# Patient Record
Sex: Female | Born: 1935
Health system: Southern US, Community
[De-identification: ages and names within clinical notes are randomized; demographics above are authoritative.]

## PROBLEM LIST (undated history)

## (undated) DIAGNOSIS — E039 Hypothyroidism, unspecified: Secondary | ICD-10-CM

## (undated) DIAGNOSIS — G43909 Migraine, unspecified, not intractable, without status migrainosus: Secondary | ICD-10-CM

## (undated) DIAGNOSIS — H3530 Unspecified macular degeneration: Secondary | ICD-10-CM

## (undated) DIAGNOSIS — K219 Gastro-esophageal reflux disease without esophagitis: Secondary | ICD-10-CM

## (undated) DIAGNOSIS — J189 Pneumonia, unspecified organism: Secondary | ICD-10-CM

## (undated) DIAGNOSIS — E079 Disorder of thyroid, unspecified: Secondary | ICD-10-CM

## (undated) DIAGNOSIS — M1990 Unspecified osteoarthritis, unspecified site: Secondary | ICD-10-CM

## (undated) DIAGNOSIS — K589 Irritable bowel syndrome without diarrhea: Secondary | ICD-10-CM

## (undated) DIAGNOSIS — H409 Unspecified glaucoma: Secondary | ICD-10-CM

## (undated) DIAGNOSIS — C801 Malignant (primary) neoplasm, unspecified: Secondary | ICD-10-CM

## (undated) DIAGNOSIS — I10 Essential (primary) hypertension: Secondary | ICD-10-CM

## (undated) DIAGNOSIS — T7840XA Allergy, unspecified, initial encounter: Secondary | ICD-10-CM

## (undated) DIAGNOSIS — E785 Hyperlipidemia, unspecified: Secondary | ICD-10-CM

## (undated) DIAGNOSIS — I951 Orthostatic hypotension: Secondary | ICD-10-CM

## (undated) HISTORY — DX: Migraine, unspecified, not intractable, without status migrainosus: G43.909

## (undated) HISTORY — DX: Malignant (primary) neoplasm, unspecified: C80.1

## (undated) HISTORY — PX: SQUAMOUS CELL CARCINOMA EXCISION: SHX2433

## (undated) HISTORY — DX: Allergy, unspecified, initial encounter: T78.40XA

## (undated) HISTORY — PX: FETAL BLOOD TRANSFUSION: SHX1602

## (undated) HISTORY — DX: Unspecified osteoarthritis, unspecified site: M19.90

## (undated) HISTORY — DX: Pneumonia, unspecified organism: J18.9

## (undated) HISTORY — DX: Hyperlipidemia, unspecified: E78.5

## (undated) HISTORY — PX: ABDOMINAL HYSTERECTOMY: SHX81

## (undated) HISTORY — PX: THYROID LOBECTOMY: SHX420

## (undated) HISTORY — DX: Disorder of thyroid, unspecified: E07.9

---
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
## 2008-10-21 DIAGNOSIS — H3530 Unspecified macular degeneration: Secondary | ICD-10-CM

## 2008-10-21 HISTORY — DX: Unspecified macular degeneration: H35.30

## 2017-03-03 LAB — HM MAMMOGRAPHY

## 2019-03-04 LAB — CBC AND DIFFERENTIAL
Neutrophils Absolute: 2270
WBC: 5

## 2019-03-04 LAB — TSH: TSH: 1.72 (ref <=5.90)

## 2019-03-04 LAB — HEPATIC FUNCTION PANEL: Bilirubin, Total: 0.6

## 2019-03-04 LAB — BASIC METABOLIC PANEL: Sodium: 132 — AB (ref 137–147)

## 2019-04-19 ENCOUNTER — Encounter: Payer: Self-pay | Admitting: Family Medicine

## 2019-04-19 ENCOUNTER — Ambulatory Visit (INDEPENDENT_AMBULATORY_CARE_PROVIDER_SITE_OTHER): Payer: Medicare Other | Admitting: Family Medicine

## 2019-04-19 ENCOUNTER — Other Ambulatory Visit: Payer: Self-pay

## 2019-04-19 VITALS — BP 139/60 | HR 63 | Temp 97.3°F | Ht 62.0 in | Wt 112.2 lb

## 2019-04-19 DIAGNOSIS — I10 Essential (primary) hypertension: Secondary | ICD-10-CM

## 2019-04-19 DIAGNOSIS — E039 Hypothyroidism, unspecified: Secondary | ICD-10-CM | POA: Diagnosis not present

## 2019-04-19 DIAGNOSIS — G3184 Mild cognitive impairment, so stated: Secondary | ICD-10-CM | POA: Diagnosis not present

## 2019-04-19 DIAGNOSIS — E785 Hyperlipidemia, unspecified: Secondary | ICD-10-CM | POA: Insufficient documentation

## 2019-04-19 DIAGNOSIS — I951 Orthostatic hypotension: Secondary | ICD-10-CM

## 2019-04-19 DIAGNOSIS — N3281 Overactive bladder: Secondary | ICD-10-CM | POA: Insufficient documentation

## 2019-04-19 DIAGNOSIS — K219 Gastro-esophageal reflux disease without esophagitis: Secondary | ICD-10-CM | POA: Insufficient documentation

## 2019-04-19 DIAGNOSIS — K589 Irritable bowel syndrome without diarrhea: Secondary | ICD-10-CM

## 2019-04-19 DIAGNOSIS — H409 Unspecified glaucoma: Secondary | ICD-10-CM

## 2019-04-19 HISTORY — DX: Unspecified glaucoma: H40.9

## 2019-04-19 HISTORY — DX: Gastro-esophageal reflux disease without esophagitis: K21.9

## 2019-04-19 HISTORY — DX: Orthostatic hypotension: I95.1

## 2019-04-19 HISTORY — DX: Hypothyroidism, unspecified: E03.9

## 2019-04-19 HISTORY — DX: Essential (primary) hypertension: I10

## 2019-04-19 HISTORY — DX: Irritable bowel syndrome, unspecified: K58.9

## 2019-04-19 LAB — CBC
HCT: 32.5 % — ABNORMAL LOW (ref 36.0–46.0)
Hemoglobin: 11.1 g/dL — ABNORMAL LOW (ref 12.0–15.0)
MCHC: 34.1 g/dL (ref 30.0–36.0)
MCV: 97.6 fl (ref 78.0–100.0)
Platelets: 136 10*3/uL — ABNORMAL LOW (ref 150.0–400.0)
RBC: 3.33 Mil/uL — ABNORMAL LOW (ref 3.87–5.11)
RDW: 16.1 % — ABNORMAL HIGH (ref 11.5–15.5)
WBC: 3.8 10*3/uL — ABNORMAL LOW (ref 4.0–10.5)

## 2019-04-19 LAB — POCT URINALYSIS DIPSTICK
Bilirubin, UA: NEGATIVE
Blood, UA: NEGATIVE
Glucose, UA: NEGATIVE
Ketones, UA: NEGATIVE
Nitrite, UA: NEGATIVE
Protein, UA: NEGATIVE
Spec Grav, UA: 1.01 (ref 1.010–1.025)
Urobilinogen, UA: 0.2 E.U./dL
pH, UA: 6 (ref 5.0–8.0)

## 2019-04-19 NOTE — Assessment & Plan Note (Signed)
Her feelings of off balance to be more consistent with orthostasis.  Neurological exam today is normal aside from her abnormal mini cog.  Encouraged good oral hydration.  May need to stop blood pressure medications if continues to be a problem.

## 2019-04-19 NOTE — Progress Notes (Signed)
Chief Complaint:  Kerri Simmons is a 85 y.o. female who presents today with a chief complaint of memory loss. History is provided by the patient and her daughter.   Assessment/Plan:  Mild cognitive impairment Unsure if this represents a true decline over the past month or if her daughter is just more aware of it now that she is living with her.  She had 1 out of 3 delayed word recall on her mini cog testing today.  Will check for reversible causes today. PHQ negative - no signs of depression. Check urine culture, CBC, C met, TSH, and vitamin B12.  If these are all normal, will need referral to neurology for further evaluation/management.  Patient and daughter were agreeable to this plan.  Orthostasis Her feelings of off balance to be more consistent with orthostasis.  Neurological exam today is normal aside from her abnormal mini cog.  Encouraged good oral hydration.  May need to stop blood pressure medications if continues to be a problem.  Essential hypertension Stable.  Continue ramipril 20 mg daily.  Advised her to stop taking HCTZ as needed as this could potentially contribute her orthostasis and other electrolyte derangements.   Hypothyroidism Continue Synthroid.  Check TSH.  Overactive bladder Continue Myrbetriq 25 mg daily.  IBS (irritable bowel syndrome) Continue dicyclomine as needed.  GERD (gastroesophageal reflux disease) Continue Nexium 40 mg daily.  Check B12.  Dyslipidemia Given her advanced age and cognitive impairment issues, do not think she is getting much benefit from her statin and aspirin at this point.  Patient and daughter agreed to this.  We will stop both of these.    Glaucoma Continue management per ophthalmology.     Subjective:  HPI:  Memory Loss Patient recently relocated here from [place], [place] about 4 weeks ago.  She is now living with her daughter.  Her daughter notes that the patient has had increasing memory loss issues over the  past month or so.  Daughter not noticed this previously.  She frequently has issues with short-term memory.  She will forget what she had for breakfast.  She will forget tasks that she needs to complete.  Daughter does not have any concerns about ADLs.  She is able to feed, dress, and regular self without issue.  She has a strong family history of dementia in her siblings and father.  Denies any audiovisual hallucinations.  Overall feels like her mood is "okay".  No feelings of being down, depressed, or hopeless.  No other symptoms.  No fevers or chills.  No dysuria.  She has had some "off balance" issues for the past several months as well.  This mostly occurs when standing from a seated position too rapidly.  She will regain balance and then feel normal. No room spinning sensations.   Her stable, chronic medical conditions are outlined below:  # Essential Hypertension - On ramipril 44m once per day and tolerating well - was on HCTZ as needed however is not taking this over the last few weeks - ROS: No reported chest pain or shortness of breath  # Hypothyroidism - On synthroid 527m daily and tolerating well  # Overactive Bladder - On myrbetriq 2581maily and tolerating well  # IBS - On dicyclomine 18m17m6 times daily as needed  -Only has to use this a few times per month.  # GERD - Takes nexium 40mg32mly and tolerating well  # Dyslipidemia - On atorvastatin 20mg 71my and tolerating well -She is also  on baby aspirin daily.  # Allergic Rhinitis - Takes claritin daily and tolerating well  ROS: Per HPI, otherwise a complete review of systems was negative.   PMH:  The following were reviewed and entered/updated in epic: Past Medical History:  Diagnosis Date  . Allergy   . Arthritis   . Cancer ([place])   . Hyperlipidemia   . Hypertension   . Migraines   . Thyroid disease    Patient Active Problem List   Diagnosis Date Noted  . Mild cognitive impairment 04/19/2019  .  Orthostasis 04/19/2019  . Essential hypertension 04/19/2019  . Hypothyroidism 04/19/2019  . Overactive bladder 04/19/2019  . IBS (irritable bowel syndrome) 04/19/2019  . GERD (gastroesophageal reflux disease) 04/19/2019  . Dyslipidemia 04/19/2019  . Glaucoma 04/19/2019   Past Surgical History:  Procedure Laterality Date  . ABDOMINAL HYSTERECTOMY    . FETAL BLOOD TRANSFUSION      Family History  Problem Relation Age of Onset  . Dementia Father   . Dementia Brother        Vascular    Medications- reviewed and updated Current Outpatient Medications  Medication Sig Dispense Refill  . acetaminophen (TYLENOL) 120 MG suppository Place 120 mg rectally every 4 (four) hours as needed.    . Carboxymethylcellulose Sodium (EYE DROPS OP) Apply to eye.    . cycloSPORINE (RESTASIS OP) Apply to eye.    . dicyclomine (BENTYL) 10 MG capsule Take 10 mg by mouth every 6 (six) hours as needed for spasms.    Marland Kitchen esomeprazole (NEXIUM) 40 MG capsule Take 40 mg by mouth 2 (two) times a day.    . folic acid (FOLVITE) 1 MG tablet Take 1 mg by mouth daily.    Marland Kitchen levothyroxine (SYNTHROID) 50 MCG tablet Take 50 mcg by mouth daily before breakfast.    . loratadine (CLARITIN) 10 MG tablet Take 10 mg by mouth daily.    . mirabegron ER (MYRBETRIQ) 25 MG TB24 tablet Take 25 mg by mouth daily.    . Multiple Minerals-Vitamins (CALCIUM CITRATE-MAG-MINERALS PO) Take by mouth.    . Multiple Vitamins-Minerals (PRESERVISION AREDS 2 PO) Take by mouth 2 (two) times a day.    . ramipril (ALTACE) 10 MG capsule Take 20 mg by mouth daily.     No current facility-administered medications for this visit.     Allergies-reviewed and updated Allergies  Allergen Reactions  . Sulfa Antibiotics     Social History   Socioeconomic History  . Marital status: Not on file    Spouse name: Not on file  . Number of children: Not on file  . Years of education: Not on file  . Highest education level: Not on file  Occupational  History  . Not on file  Social Needs  . Financial resource strain: Not on file  . Food insecurity    Worry: Not on file    Inability: Not on file  . Transportation needs    Medical: Not on file    Non-medical: Not on file  Tobacco Use  . Smoking status: Never Smoker  . Smokeless tobacco: Never Used  Substance and Sexual Activity  . Alcohol use: Never    Frequency: Never  . Drug use: Never  . Sexual activity: Never  Lifestyle  . Physical activity    Days per week: Not on file    Minutes per session: Not on file  . Stress: Not on file  Relationships  . Social Herbalist on  phone: Not on file    Gets together: Not on file    Attends religious service: Not on file    Active member of club or organization: Not on file    Attends meetings of clubs or organizations: Not on file    Relationship status: Not on file  Other Topics Concern  . Not on file  Social History Narrative  . Not on file         Objective:  Physical Exam: BP 139/60 (BP Location: Left Arm, Patient Position: Sitting, Cuff Size: Normal)   Pulse 63   Temp (!) 97.3 F (36.3 C) (Oral)   Ht 5' 2" (1.575 m)   Wt 112 lb 4 oz (50.9 kg)   SpO2 97%   BMI 20.53 kg/m   Gen: NAD, resting comfortably CV: Regular rate and rhythm with no murmurs appreciated Pulm: Normal work of breathing, clear to auscultation bilaterally with no crackles, wheezes, or rhonchi GI: Normal bowel sounds present. Soft, Nontender, Nondistended. MSK: No edema, cyanosis, or clubbing noted Skin: Warm, dry Neuro: Cranial nerves II through XII intact.  Strength 5 out of 5 in upper and lower extremities. - Minicog: Abnormal clock face.  1 out of 3 delayed word recall. Psych: Normal affect and thought content      Time Spent: I spent >60 minutes face-to-face with the patient and her daugher with more than half spent on coordinating care and counseling for management plan for memory loss/mild cognitive impairment, essential  hypertension, IBS, overactive bladder, dyslipidemia, and hypothyroidism.   Algis Greenhouse. Jerline Pain, MD 04/19/2019 11:12 AM

## 2019-04-19 NOTE — Assessment & Plan Note (Signed)
Continue Synthroid.  Check TSH. 

## 2019-04-19 NOTE — Assessment & Plan Note (Addendum)
Stable.  Continue ramipril 20 mg daily.  Advised her to stop taking HCTZ as needed as this could potentially contribute her orthostasis and other electrolyte derangements.

## 2019-04-19 NOTE — Assessment & Plan Note (Signed)
Continue Myrbetriq 25 mg daily.

## 2019-04-19 NOTE — Assessment & Plan Note (Signed)
Given her advanced age and cognitive impairment issues, do not think she is getting much benefit from her statin and aspirin at this point.  Patient and daughter agreed to this.  We will stop both of these.

## 2019-04-19 NOTE — Assessment & Plan Note (Signed)
Continue Nexium 40 mg daily.  Check B12.

## 2019-04-19 NOTE — Patient Instructions (Signed)
It was very nice to see you today!  Please stop taking the atorvastatin, aspirin, and HCTZ.  We will check blood work today and a urine sample to look for any reversible causes for your memory issues.  If all of your tests are normal, will place a referral for you to see neurologist or memory specialist.  No other changes today.  Take care, Dr Jerline Pain

## 2019-04-19 NOTE — Assessment & Plan Note (Addendum)
Unsure if this represents a true decline over the past month or if her daughter is just more aware of it now that she is living with her.  She had 1 out of 3 delayed word recall on her mini cog testing today.  Will check for reversible causes today. PHQ negative - no signs of depression. Check urine culture, CBC, C met, TSH, and vitamin B12.  If these are all normal, will need referral to neurology for further evaluation/management.  Patient and daughter were agreeable to this plan. 

## 2019-04-19 NOTE — Assessment & Plan Note (Signed)
Continue management per ophthalmology. 

## 2019-04-19 NOTE — Assessment & Plan Note (Signed)
Continue dicyclomine as needed.

## 2019-04-20 LAB — COMPREHENSIVE METABOLIC PANEL
ALT: 17 U/L (ref 0–35)
AST: 18 U/L (ref 0–37)
Albumin: 4 g/dL (ref 3.5–5.2)
Alkaline Phosphatase: 67 U/L (ref 39–117)
BUN: 17 mg/dL (ref 6–23)
CO2: 28 mEq/L (ref 19–32)
Calcium: 9.1 mg/dL (ref 8.4–10.5)
Chloride: 104 mEq/L (ref 96–112)
Creatinine, Ser: 0.91 mg/dL (ref 0.40–1.20)
GFR: 57.97 mL/min — ABNORMAL LOW (ref >60.00)
Glucose, Bld: 86 mg/dL (ref 70–99)
Potassium: 4.6 mEq/L (ref 3.5–5.1)
Sodium: 139 mEq/L (ref 135–145)
Total Bilirubin: 0.6 mg/dL (ref 0.2–1.2)
Total Protein: 6 g/dL (ref 6.0–8.3)

## 2019-04-20 LAB — VITAMIN B12: Vitamin B-12: 702 pg/mL (ref 211–911)

## 2019-04-20 LAB — TSH: TSH: 1.4 u[IU]/mL (ref 0.35–4.50)

## 2019-04-21 LAB — URINE CULTURE
MICRO NUMBER:: 617004
Result:: NO GROWTH
SPECIMEN QUALITY:: ADEQUATE

## 2019-04-21 NOTE — Progress Notes (Signed)
Please inform patient of the following:  She is slightly anemic but the rest of her labs are all NORMAL. No obvious cause for her memory issues. Would be a good idea for her to start an iron supplement - ferrous sulfate 325mg  every other day. Recommend referral to neurology as we discussed at her office visit. Please place referral.  Jasmain Ahlberg M. Jerline Pain, MD 04/21/2019 8:00 AM

## 2019-04-28 ENCOUNTER — Other Ambulatory Visit: Payer: Self-pay

## 2019-04-28 DIAGNOSIS — G3184 Mild cognitive impairment, so stated: Secondary | ICD-10-CM

## 2019-04-30 ENCOUNTER — Encounter: Payer: Self-pay | Admitting: Family Medicine

## 2019-05-10 ENCOUNTER — Other Ambulatory Visit: Payer: Self-pay

## 2019-05-10 ENCOUNTER — Encounter: Payer: Self-pay | Admitting: Neurology

## 2019-05-10 ENCOUNTER — Telehealth (INDEPENDENT_AMBULATORY_CARE_PROVIDER_SITE_OTHER): Payer: Medicare Other | Admitting: Neurology

## 2019-05-10 VITALS — BP 139/60 | Ht 60.0 in | Wt 112.0 lb

## 2019-05-10 DIAGNOSIS — R413 Other amnesia: Secondary | ICD-10-CM | POA: Diagnosis not present

## 2019-05-10 NOTE — Progress Notes (Signed)
Virtual Visit via Video Note The purpose of this virtual visit is to provide medical care while limiting exposure to the novel coronavirus.    Consent was obtained for video visit:  Yes.   Answered questions that patient had about telehealth interaction:  Yes.   I discussed the limitations, risks, security and privacy concerns of performing an evaluation and management service by telemedicine. I also discussed with the patient that there may be a patient responsible charge related to this service. The patient expressed understanding and agreed to proceed.  Pt location: Home Physician Location: office Name of referring provider:  Vivi Barrack, MD I connected with Kerri Simmons at patients initiation/request on 05/10/2019 at  1:00 PM EDT by video enabled telemedicine application and verified that I am speaking with the correct person using two identifiers. 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 Her daughter Kerri Simmons was present during the visit and [name] was on the phone to provide additional information. She states she did not really know she was having a problem with her memory and was surprised when her daughters raised concern. She had been living alone, very independent until she had a sudden change in cognitive status between March and June. She had been staying at [facility] from December 2019 to March 2020 helping her after her surgery, and had been sorting out Jackie's medications and giving them to her. They started noticing changes from March to June which they attributed to being isolated with the pandemic. Kerri Simmons noticed that her medications were messed up, her pillpacks were half taken. She was  repeating herself and forgetting things that she would usually know, for instance she is a retired Marine scientist and her granddaughter is a Marine scientist as well, but she asked what kind of work her granddaughter does or where a certain child is working. All of her bills are on autopay. She stopped driving several years ago. Family reports that the changes are "not terrible but something is going on." She is independent with dressing and bathing. Sleep is good, no wandering behavior. No paranoia or hallucinations. Mood is okay but [name] notices that when she is home alone "she loses her sparkle."   She has occasional headaches with pain on the left temporal region. She has occasional dizziness. No diplopia, dysarthria/dysphagia, neck pain, focal numbness/tingling/weakness, bowel/bladder dysfunction, anosmia, or tremors. She has noticed numbness in her fingertips that she cannot hold on to things. She uses a cane in open spaces because her balance is off. She denies any falls but states she hit her head on the door, again pointing to the left temporal region. Her father and several siblings had vascular dementia in their mid-50s. She denies any alcohol use.   Laboratory Data: Lab Results  Component Value Date   TSH 1.40 04/19/2019   Lab Results  Component Value Date   [id number] 353 04/19/2019     PAST MEDICAL HISTORY: Past Medical History:  Diagnosis Date   Allergy    Arthritis    Cancer ([place])    Hyperlipidemia    Hypertension    Migraines    Pneumonia    Thyroid disease     PAST SURGICAL HISTORY: Past Surgical History:  Procedure Laterality Date  ABDOMINAL HYSTERECTOMY     FETAL BLOOD TRANSFUSION      MEDICATIONS: Current Outpatient Medications on File Prior to Visit  Medication Sig Dispense Refill   Carboxymethylcellulose Sodium (EYE DROPS OP) Apply to eye.     cycloSPORINE (RESTASIS OP) Apply to eye.     esomeprazole (NEXIUM) 40 MG capsule Take 40 mg by mouth 2 (two) times a  day.     ferrous sulfate 325 (65 FE) MG tablet Take 325 mg by mouth daily with breakfast.     levothyroxine (SYNTHROID) 50 MCG tablet Take 50 mcg by mouth daily before breakfast.     loratadine (CLARITIN) 10 MG tablet Take 10 mg by mouth daily.     mirabegron ER (MYRBETRIQ) 25 MG TB24 tablet Take 25 mg by mouth daily.     Multiple Minerals-Vitamins (CALCIUM CITRATE-MAG-MINERALS PO) Take by mouth.     Multiple Vitamins-Minerals (PRESERVISION AREDS 2 PO) Take by mouth 2 (two) times a day.     ramipril (ALTACE) 10 MG capsule Take 20 mg by mouth daily.     No current facility-administered medications on file prior to visit.     ALLERGIES: Allergies  Allergen Reactions   Sulfa Antibiotics     FAMILY HISTORY: Family History  Problem Relation Age of Onset   Dementia Father    Dementia Brother        Vascular    Observations/Objective:   Vitals:   05/10/19 1058  BP: 139/60  Weight: 112 lb (50.8 kg)  Height: 5' (1.524 m)   GEN:  The patient appears stated age and is in NAD.  Neurological examination: Patient is awake, alert, oriented x 3. No aphasia or dysarthria. Intact fluency and comprehension. Remote and recent memory impaired. Able to name and repeat. Cranial nerves: Extraocular movements intact with no nystagmus. No facial asymmetry. Motor: moves all extremities symmetrically, at least anti-gravity x 4. No incoordination on finger to nose testing. Gait: narrow-based and steady, able to tandem walk adequately. Negative Romberg test. No tremors  Montreal Cognitive Assessment Blind 05/10/2019  Attention: Read list of digits (0/2) 2  Attention: Read list of letters (0/1) 1  Attention: Serial 7 subtraction starting at 100 (0/3) 1  Language: Repeat phrase (0/2) 1  Language : Fluency (0/1) 1  Abstraction (0/2) 1  Delayed Recall (0/5) 0  Orientation (0/6) 5  Total 12/22    Assessment and Plan:   This is a pleasant 85 year old right-handed woman with a history of  hypertension, hypothyroidism, presenting for evaluation of acute memory changes that started around 4 months ago. Family reports that she has been quite active and independent up until March. Her MOCA blind (done over phone) is 12/22, suggestive of mild dementia, however with quite acute onset by history, underlying structural abnormality needs to be ruled out. Head CT without contrast will be ordered. We also discussed how isolation/lack of stimulation likely contributed, and with living with her daughter, this is still a change from her routine. Continue close supervision, follow-up in 6 months, they know to call for any changes.   Follow Up Instructions:   -I discussed the assessment and treatment plan with the patient. The patient was provided an opportunity to ask questions and all were answered. The patient agreed with the plan and demonstrated an understanding of the instructions.   The patient was advised to call back or seek an in-person evaluation if the symptoms worsen or if the condition fails to improve as anticipated.  Cameron Sprang, MD

## 2019-05-11 ENCOUNTER — Other Ambulatory Visit: Payer: Self-pay

## 2019-05-11 DIAGNOSIS — R413 Other amnesia: Secondary | ICD-10-CM

## 2019-05-12 ENCOUNTER — Telehealth: Payer: Self-pay | Admitting: Neurology

## 2019-05-12 NOTE — Telephone Encounter (Signed)
Daughter is calling in about CAT Scan because she hasn't heard anything from them and that is the next step. Thanks!

## 2019-05-13 NOTE — Telephone Encounter (Signed)
Called [name] she never received a call for [facility] to scheduled patient CT. Gave her contact information to call them to schedule

## 2019-05-25 ENCOUNTER — Ambulatory Visit
Admission: RE | Admit: 2019-05-25 | Discharge: 2019-05-25 | Disposition: A | Discharge status: Home / Self Care | Payer: Medicare Other | Source: Ambulatory Visit | Attending: Neurology | Admitting: Neurology

## 2019-05-25 ENCOUNTER — Other Ambulatory Visit: Payer: Self-pay

## 2019-05-25 DIAGNOSIS — R413 Other amnesia: Secondary | ICD-10-CM

## 2019-05-26 ENCOUNTER — Telehealth: Payer: Self-pay

## 2019-05-26 NOTE — Telephone Encounter (Signed)
-----   Message from Cameron Sprang, MD sent at 05/26/2019  8:46 AM EDT ----- Pls let daughter know the head CT did not show any evidence of tumor, stroke, or bleed. It showed age-related changes. thanks

## 2019-05-26 NOTE — Telephone Encounter (Signed)
Daughter informed of normal CT results. 6 month follow up appt made in March. Daughter would like to discuss with her sister the results and if there are any other recommendations for pt. She will communicate through [facility]

## 2019-06-01 ENCOUNTER — Telehealth: Payer: Self-pay | Admitting: Neurology

## 2019-06-01 NOTE — Telephone Encounter (Signed)
Daughter sent MyChart message on 05/27/19 (last Thursday) with some questions she wanted answered. She still has not received a response. Is wanting to know what is going on. Please see previous MyChart message in patient chart. Thanks!

## 2019-06-02 NOTE — Telephone Encounter (Signed)
Looks like she called as I was typing answer. It says message read at 6pm yesterday.

## 2019-06-02 NOTE — Telephone Encounter (Signed)
Dr. Delice Lesch,  I seen the note you typed up for pt. Did it never get sent?

## 2019-06-04 ENCOUNTER — Telehealth: Payer: Self-pay | Admitting: Neurology

## 2019-06-04 NOTE — Telephone Encounter (Signed)
Pt daughter states that she sent a message my chart and has not heard back for anyone. She would like to move forward with getting her mother a anti-depressant called in to the [facility] on [address]

## 2019-06-07 NOTE — Telephone Encounter (Signed)
Dr. Delice Lesch is addressing the message.

## 2019-06-08 ENCOUNTER — Telehealth: Payer: Self-pay | Admitting: Family Medicine

## 2019-06-08 ENCOUNTER — Other Ambulatory Visit: Payer: Self-pay

## 2019-06-08 DIAGNOSIS — Z85828 Personal history of other malignant neoplasm of skin: Secondary | ICD-10-CM

## 2019-06-08 NOTE — Telephone Encounter (Signed)
Referral has been placed. 

## 2019-06-08 NOTE — Telephone Encounter (Signed)
Please advise - ok to place referral?

## 2019-06-08 NOTE — Telephone Encounter (Signed)
See note  Copied from [place] #[id number]. Topic: Referral - Request for Referral >> Jun 08, 2019 11:52 AM Erick Blinks wrote: Has patient seen PCP for this complaint? Yes.   *If NO, is insurance requiring patient see PCP for this issue before PCP can refer them? Referral for which specialty: Dermatology  Preferred provider/office: [facility] Reason for referral: Skin cancer pt, has a spot on her face that she is concerned about  Best contact: [phone] Dalene Seltzer)

## 2019-06-08 NOTE — Telephone Encounter (Signed)
Ok with me. Please place any necessary orders. 

## 2019-06-09 ENCOUNTER — Telehealth: Payer: Self-pay | Admitting: Neurology

## 2019-06-09 ENCOUNTER — Other Ambulatory Visit: Payer: Self-pay | Admitting: Neurology

## 2019-06-09 MED ORDER — ESCITALOPRAM OXALATE 5 MG PO TABS: 5.0000 mg | ORAL_TABLET | Freq: Every day | ORAL | 11 refills | Status: DC

## 2019-06-09 NOTE — Telephone Encounter (Signed)
Patients daughter calling back and asking for some medication for her mom, has not received message back. Can You start patient on a mild medication for her

## 2019-06-09 NOTE — Telephone Encounter (Signed)
Pls let daughter know that I replied to her MyChart message and have sent in the prescription for Lexapro 5mg  daily. Pls also kindly remind her that for urgent concerns, phone calls may be faster if she does not get a response quickly from [facility], which are for non-urgent questions. Thanks

## 2019-06-09 NOTE — Telephone Encounter (Signed)
Caller left msg with after hours about medications for her mother. Asking for a call back to get this straightened out. She is having memory issues suddenly, anti-depressant treatment, dizziness, small doses started. Thanks!

## 2019-06-09 NOTE — Telephone Encounter (Signed)
Notified daughter of the new Rx

## 2019-07-08 ENCOUNTER — Telehealth: Payer: Self-pay | Admitting: Neurology

## 2019-07-08 NOTE — Telephone Encounter (Signed)
Daughter left VM about medication questions and having questions about what is next. Thanks!

## 2019-07-08 NOTE — Telephone Encounter (Signed)
Daughter states that she is not sure if she wants pt to continue on Lexapro. She has not seen any improvement. Not sure what to do at this point. She feels that pt is a little fuzzy at times and may be somewhat depressed.  Pt herself googled Lexapro and questioned why she is on Lexapro and states that she is not depressed and why does she need to take it.  Daughter is uncertain about taking another medication just wants suggestions.

## 2019-07-08 NOTE — Telephone Encounter (Signed)
Left message for pt to return call.  Is the Lexapro not helping?

## 2019-07-09 NOTE — Telephone Encounter (Signed)
For Dr. Delice Lesch to review

## 2019-07-12 ENCOUNTER — Ambulatory Visit (INDEPENDENT_AMBULATORY_CARE_PROVIDER_SITE_OTHER): Payer: Medicare Other

## 2019-07-12 DIAGNOSIS — Z23 Encounter for immunization: Secondary | ICD-10-CM | POA: Diagnosis not present

## 2019-07-15 ENCOUNTER — Telehealth: Payer: Self-pay | Admitting: Family Medicine

## 2019-07-15 NOTE — Telephone Encounter (Signed)
Requested medication (s) are due for refill today: yes  Requested medication (s) are on the active medication list yes  Last refill:  04/19/2019  Future visit scheduled: no  Notes to clinic:  no   Requested Prescriptions  Pending Prescriptions Disp Refills   esomeprazole (NEXIUM) 40 MG capsule       Sig: Take 1 capsule (40 mg total) by mouth.     Gastroenterology: Proton Pump Inhibitors Passed - 07/15/2019  1:48 PM      Passed - Valid encounter within last 12 months    Recent Outpatient Visits          2 months ago Mild cognitive impairment   [facility] [address] Algis Greenhouse, MD

## 2019-07-15 NOTE — Telephone Encounter (Signed)
Copied from [place] [id number]. Topic: Quick Communication - Rx Refill/Question >> Jul 15, 2019  1:42 PM Leward Quan A wrote: Medication: esomeprazole (NEXIUM) 40 MG capsule  Has the patient contacted their pharmacy? Yes (Agent: If no, request that the patient contact the pharmacy for the refill.) (Agent: If yes, when and what did the pharmacy advise?)  Preferred Pharmacy (with phone number or street name): [facility] [place], [place] [facility] [phone] (Phone) [phone] (Fax)    Agent: Please be advised that RX refills may take up to 3 business days. We ask that you follow-up with your pharmacy.

## 2019-07-15 NOTE — Telephone Encounter (Signed)
It is a pretty comprehensive memory test that will be helpful for me to help them better. Pls have her think about it, thanks.

## 2019-07-15 NOTE — Telephone Encounter (Signed)
Pls let daughter know that we can do more in-depth memory testing with a Neurocognitive evaluation to determine if there is anything else contributing to her symptoms. If they do not want to continue on lexapro, that is fine, it is a low dose that she can just stop. If agreeable, pls send order for Neuropsych testing with Dr. Melvyn Novas. Thanks

## 2019-07-15 NOTE — Telephone Encounter (Signed)
See note

## 2019-07-15 NOTE — Telephone Encounter (Signed)
Daughter agrees to discontinue Lexapro. She will call back in a few weeks with an update.  She is reluctant to schedule Neurocognitive testing at this time. Daughter thinking that may be daughter Delice Lesch should re-evaluate pt first. Pt is still thinking she is going back to her house to live ( Her home still has her belongings that the daughter goes to to get pts clothes and provides maintenance to)  Daughter believes that pt just doesn't understand or does not know what she wants. Even when it comes to grocery shopping.  Next appt is in March.

## 2019-07-16 MED ORDER — ESOMEPRAZOLE MAGNESIUM 40 MG PO CPDR: 40.0000 mg | DELAYED_RELEASE_CAPSULE | Freq: Once | ORAL | 0 refills | Status: DC

## 2019-07-19 ENCOUNTER — Other Ambulatory Visit: Payer: Self-pay

## 2019-07-19 MED ORDER — ESOMEPRAZOLE MAGNESIUM 40 MG PO CPDR: 40.0000 mg | DELAYED_RELEASE_CAPSULE | Freq: Every day | ORAL | 0 refills | Status: DC

## 2019-07-23 NOTE — Telephone Encounter (Addendum)
°  Relation to pt: Kerri Simmons  Call back number: [phone]  Pharmacy: [facility] [phone] (Phone) [phone] (Fax)      Reason for call:   esomeprazole (NEXIUM) 40 MG capsule states it should be 2x daily not 1x daily and patient will run out prior to next refill, please advise  ramipril (ALTACE) 10 MG capsule and levothyroxine (SYNTHROID) 50 MCG tablet 90 day supply, informed please allow 48 to 72 hour turn around time.   Requesting Back brace orders would like PCP to recommend where to send orders.

## 2019-07-27 ENCOUNTER — Other Ambulatory Visit: Payer: Self-pay

## 2019-07-27 MED ORDER — RAMIPRIL 10 MG PO CAPS: 20.0000 mg | ORAL_CAPSULE | Freq: Every day | ORAL | 2 refills | Status: DC

## 2019-07-27 MED ORDER — LEVOTHYROXINE SODIUM 50 MCG PO TABS: 50.0000 ug | ORAL_TABLET | Freq: Every day | ORAL | 2 refills | Status: DC

## 2019-07-27 NOTE — Telephone Encounter (Signed)
Clarification of Nexium,patient daughter stated she is taking 40 mg of Nexium twice daily I informed her last OV notes stated to continue 40 mg daily. Also patient is requesting a back brace,does she need a referral to Ortho. Please advise.

## 2019-07-28 ENCOUNTER — Other Ambulatory Visit: Payer: Self-pay

## 2019-07-28 DIAGNOSIS — M549 Dorsalgia, unspecified: Secondary | ICD-10-CM

## 2019-07-28 MED ORDER — ESOMEPRAZOLE MAGNESIUM 40 MG PO CPDR: 40.0000 mg | DELAYED_RELEASE_CAPSULE | Freq: Every day | ORAL | 3 refills | Status: DC

## 2019-07-28 NOTE — Telephone Encounter (Signed)
Referral Placed. Patient notified

## 2019-07-28 NOTE — Telephone Encounter (Signed)
Ok with referral to ortho. Would like for her to take nexium once daily - if still having issues then we can talk about increasing.  Algis Greenhouse. Jerline Pain, MD 07/28/2019 8:05 AM

## 2019-08-11 ENCOUNTER — Other Ambulatory Visit: Payer: Self-pay

## 2019-08-11 ENCOUNTER — Ambulatory Visit: Payer: Self-pay

## 2019-08-11 ENCOUNTER — Ambulatory Visit (INDEPENDENT_AMBULATORY_CARE_PROVIDER_SITE_OTHER): Payer: Medicare Other | Admitting: Family Medicine

## 2019-08-11 ENCOUNTER — Encounter: Payer: Self-pay | Admitting: Family Medicine

## 2019-08-11 VITALS — Ht 61.5 in

## 2019-08-11 DIAGNOSIS — M546 Pain in thoracic spine: Secondary | ICD-10-CM | POA: Diagnosis not present

## 2019-08-11 DIAGNOSIS — M545 Low back pain, unspecified: Secondary | ICD-10-CM

## 2019-08-11 DIAGNOSIS — G8929 Other chronic pain: Secondary | ICD-10-CM

## 2019-08-11 NOTE — Progress Notes (Signed)
Office Visit Note   Patient: Kerri Simmons           Date of Birth: Mar 19, 1926           MRN: [id number] Visit Date: 08/11/2019 Requested by: Vivi Barrack, MD [id number] [place],  [place] [address] PCP: Vivi Barrack, MD  Subjective: Chief Complaint  Patient presents with  . Middle Back - Pain    Pain in middle part of her back. Wears a brace daily (takes it off at night) - this one is about 83 years old.    HPI:  She is here with thoracolumbar back pain.  She has had scoliosis since childhood.  She has worn a brace since around age 76.  Her most recent brace is about 83 years old and no longer seems to be working as well.  She has more pain when trying to be up moving around.  She would like a new one if possible.  Denies any radicular symptoms, denies any bowel or bladder dysfunction.  Denies any weakness in her legs other than from disuse.  She does have a history of osteopenia and she has lost about 3 or 4 inches from her maximum height of 5 foot 4.              ROS: No fevers or chills.  All other systems were reviewed and are negative.  Objective: Vital Signs: There were no vitals taken for this visit.  Physical Exam:  General:  Alert and oriented, in no acute distress. Pulm:  Breathing unlabored. Psy:  Normal mood, congruent affect.  Back: She has no tenderness over the thoracic spine.  She does have some scoliosis.  The upper lumbar spinous processes are tender down to about L4.  No pain in sciatic notch, negative straight leg raise, lower extremity strength and reflexes are normal.  Imaging: X-rays thoracolumbar spine: No sign of neoplasm.  She has calcified abdominal aorta.  She has a double curve, thoracic is convex to the right and lumbar is convex to the left.  Lumbar curve is more pronounced.  She may have a compression deformity in the lower thoracic spine.    Assessment & Plan: 1.  Chronic thoracolumbar back pain with scoliosis and spondylosis -We will try a  corset brace.  If this is not the proper fit, then we will order 1.     Procedures: No procedures performed  No notes on file     PMFS History: Patient Active Problem List   Diagnosis Date Noted  . Mild cognitive impairment 04/19/2019  . Orthostasis 04/19/2019  . Essential hypertension 04/19/2019  . Hypothyroidism 04/19/2019  . Overactive bladder 04/19/2019  . IBS (irritable bowel syndrome) 04/19/2019  . GERD (gastroesophageal reflux disease) 04/19/2019  . Dyslipidemia 04/19/2019  . Glaucoma 04/19/2019  . Macular degeneration of both eyes 10/21/2008   Past Medical History:  Diagnosis Date  . Allergy   . Arthritis   . Cancer ([place])   . Hyperlipidemia   . Hypertension   . Migraines   . Pneumonia   . Thyroid disease     Family History  Problem Relation Age of Onset  . Dementia Father   . Dementia Brother        Vascular    Past Surgical History:  Procedure Laterality Date  . ABDOMINAL HYSTERECTOMY    . FETAL BLOOD TRANSFUSION     Social History   Occupational History  . Not on file  Tobacco Use  .  Smoking status: Never Smoker  . Smokeless tobacco: Never Used  Substance and Sexual Activity  . Alcohol use: Never    Frequency: Never  . Drug use: Never  . Sexual activity: Never

## 2019-08-16 ENCOUNTER — Ambulatory Visit (INDEPENDENT_AMBULATORY_CARE_PROVIDER_SITE_OTHER): Payer: Medicare Other | Admitting: Family Medicine

## 2019-08-16 ENCOUNTER — Other Ambulatory Visit: Payer: Self-pay

## 2019-08-16 DIAGNOSIS — K219 Gastro-esophageal reflux disease without esophagitis: Secondary | ICD-10-CM

## 2019-08-16 DIAGNOSIS — R05 Cough: Secondary | ICD-10-CM | POA: Diagnosis not present

## 2019-08-16 DIAGNOSIS — G3184 Mild cognitive impairment, so stated: Secondary | ICD-10-CM

## 2019-08-16 DIAGNOSIS — R413 Other amnesia: Secondary | ICD-10-CM

## 2019-08-16 DIAGNOSIS — R059 Cough, unspecified: Secondary | ICD-10-CM

## 2019-08-16 MED ORDER — AZELASTINE HCL 0.1 % NA SOLN: 2.0000 | Freq: Two times a day (BID) | NASAL | 12 refills | Status: DC

## 2019-08-16 MED ORDER — ESOMEPRAZOLE MAGNESIUM 40 MG PO CPDR: 40.0000 mg | DELAYED_RELEASE_CAPSULE | Freq: Two times a day (BID) | ORAL | 3 refills | Status: DC

## 2019-08-16 MED ORDER — AZITHROMYCIN 250 MG PO TABS: ORAL_TABLET | ORAL | 0 refills | Status: DC

## 2019-08-16 NOTE — Assessment & Plan Note (Signed)
Switch Nexium back to 40 mg twice daily.  New prescription sent in.

## 2019-08-16 NOTE — Progress Notes (Signed)
    Chief Complaint:  Kerri Simmons is a 85 y.o. female who presents today for a virtual office visit with a chief complaint of cough.   Assessment/Plan:  Cough No red flags.  Likely mild viral URI.  Start Astelin nasal spray.  Will send in "pocket prescription" for azithromycin with strict instruction not start with symptoms worsen or not proved.  Low suspicion for Covid- patient deferred testing for the time being.  Encouraged good oral hydration.  Discussed reasons return to care.  Follow-up as needed.  GERD (gastroesophageal reflux disease) Switch Nexium back to 40 mg twice daily.  New prescription sent in.    Subjective:  HPI:  Cough  Started about a week ago. Symptoms seem to be improving. Associated symptoms include rhinorrhea and sore throat.  No fever or chills.  No sick contacts.  She has tried taking Claritin and Flonase with no improvement.  No body aches.  No other treatments tried.  No other obvious aggravating or alleviating factors.  She is also taking Nexium 40 mg daily for her GERD.  She is previously on twice daily.  She thinks twice daily dosing works better.    ROS: Per HPI  PMH: She reports that she has never smoked. She has never used smokeless tobacco. She reports that she does not drink alcohol or use drugs.      Objective/Observations  Physical Exam: Gen: NAD, resting comfortably Pulm: Normal work of breathing Psych: Normal affect and thought content   Virtual Visit via Video   I connected with Mariko Rudge on 08/16/19 at  3:40 PM EDT by a video enabled telemedicine application and verified that I am speaking with the correct person using two identifiers. I discussed the limitations of evaluation and management by telemedicine and the availability of in person appointments. The patient expressed understanding and agreed to proceed.   Patient location: Home Provider location: [place] participating in the virtual  visit: Myself and Patient     Algis Greenhouse. Jerline Pain, MD 08/16/2019 4:05 PM

## 2019-08-18 ENCOUNTER — Other Ambulatory Visit: Payer: Self-pay

## 2019-08-18 ENCOUNTER — Ambulatory Visit: Payer: Medicare Other

## 2019-08-18 ENCOUNTER — Encounter: Payer: Self-pay | Admitting: Psychology

## 2019-08-18 ENCOUNTER — Ambulatory Visit (INDEPENDENT_AMBULATORY_CARE_PROVIDER_SITE_OTHER): Payer: Medicare Other | Admitting: Psychology

## 2019-08-18 DIAGNOSIS — R413 Other amnesia: Secondary | ICD-10-CM

## 2019-08-18 DIAGNOSIS — G3184 Mild cognitive impairment, so stated: Secondary | ICD-10-CM | POA: Diagnosis not present

## 2019-08-18 NOTE — Progress Notes (Signed)
NEUROPSYCHOLOGICAL EVALUATION [place] [place] [facility]  Reason for Referral:   Kerri Simmons is a 85 y.o. Caucasian female referred by Kerri Simmons, M.D., to characterize her current cognitive functioning and assist with diagnostic clarity and treatment planning in the context of subjective cognitive decline.  Assessment and Plan:   Clinical Impression(s): Kerri Simmons pattern of performance is suggestive of primary difficulties surrounding cognitive flexibility and information retrieval, including deficits across tasks assessing semantic fluency and verbal learning and memory. Additional variability was seen across tasks assessing processing speed. Performance was within appropriate normative ranges given pre-morbid intellectual estimates across domains of attention/concentration, assessed executive functions outside of cognitive flexibility, receptive language, phonemic fluency, confrontation naming, visuospatial abilities, and visual learning and memory. Overall, given evidence for cognitive dysfunction, coupled with Kerri Simmons's report of variable but largely intact ability to complete activities of daily living (ADLs), she meets criteria for a Mild Neurocognitive Disorder (formerly "mild cognitive impairment") at the present time. Psychiatric symptoms were within normal limits.  Overall, the etiology of these deficits are unclear. Deficits with cognitive flexibility and verbal retrieval could certainly be related to neuroimaging which revealed chronic microvascular angiopathy changes and parenchymal volume loss, suggestive of a more vascular presentation. Poor performance across verbal memory measures is also concerning. This was particularly noteworthy across a story learning task, where her performance across learning trials of the story was well above average for her age, yet she was fully amnestic to the story after a brief delay. While amnestic  memory performance can suggest an Alzheimer's disease presentation, Kerri Simmons performed strongly across a visual memory task, as well as confrontation naming tasks, which is inconsistent with this presentation. Continued medical monitoring will be important moving forward.   Recommendations: Repeat neuropsychological evaluation in 12-18 months (or sooner if functional decline is noted) is recommended to assess the trajectory of future cognitive decline should it occur. This will also aid in future efforts towards enhanced diagnostic clarity.  Continued participation in activities which provide mental stimulation and social interaction is also recommended.   Should there be a progression of her current deficits over time, Kerri Simmons is unlikely to regain any independent living skills lost. Therefore, it is recommended that she remain as involved as possible in all aspects of household chores, finances, and medication management, with supervision to ensure adequate performance. She will likely benefit from the establishment and maintenance of a routine in order to maximize her functional abilities over time.  When learning new information, she would benefit from information being broken up into small, manageable pieces. She may also find it helpful to articulate the material in her own words and in a context to promote encoding at the onset of a new task. This material may need to be repeated multiple times to promote encoding.  To address problems with processing speed, she may wish to consider:   -Ensuring that she is alerted when essential material or instructions are being presented   -Adjusting the speed at which new information is presented   -Allowing additional processing time or a chance to rehearse novel information   -Allowing for more time in comprehending, processing, and responding in conversation  Review of Records:   Kerri Simmons was seen by Kerri Simmons Marland KitchenEllouise Simmons, M.D.) on  05/10/2019 for an evaluation of sudden worsening memory. Kerri Simmons had been living alone and was very independent until a sudden change in cognitive status between March and June. During this time, her  daughter noticed that her medications had become disorganized and that her pillpacks were half-taken. Her daughter also noted that Kerri Simmons would repeat herself often and forget things that she would usually know (e.g., the kind of work her granddaughter does). Basic ADLs were fine. Kerri Simmons stopped driving years ago and all her bills are on autopay. Mood was said to be "okay." Ultimately, Kerri Simmons was referred for a comprehensive neuropsychological evaluation to characterize her cognitive abilities and to assist with diagnostic clarity and future treatment planning.   Head CT on 05/25/2019 revealed chronic microvascular angiopathy changes and parenchymal volume loss, but no acute intracranial abnormalities.  Past Medical History:  Diagnosis Date   Allergy    Arthritis    Cancer ([place])    Essential hypertension 04/19/2019   GERD (gastroesophageal reflux disease) 04/19/2019   Glaucoma 04/19/2019   Hyperlipidemia    Hypothyroidism 04/19/2019   IBS (irritable bowel syndrome) 04/19/2019   Macular degeneration, left eye 10/21/2008   Migraines    Orthostasis 04/19/2019   Pneumonia    Thyroid disease     Past Surgical History:  Procedure Laterality Date   ABDOMINAL HYSTERECTOMY     FETAL BLOOD TRANSFUSION      Family History  Problem Relation Age of Onset   Dementia Father    Dementia Brother        Vascular     Current Outpatient Medications:    azelastine (ASTELIN) 0.1 % nasal spray, Place 2 sprays into both nostrils 2 (two) times daily., Disp: 30 mL, Rfl: 12   azithromycin (ZITHROMAX) 250 MG tablet, Take 2 tabs day 1, then 1 tab daily, Disp: 6 each, Rfl: 0   Carboxymethylcellulose Sodium (EYE DROPS OP), Apply to eye., Disp: , Rfl:    cycloSPORINE (RESTASIS OP), Apply  to eye., Disp: , Rfl:    escitalopram (LEXAPRO) 5 MG tablet, Take 1 tablet (5 mg total) by mouth daily., Disp: 30 tablet, Rfl: 11   esomeprazole (NEXIUM) 40 MG capsule, Take 1 capsule (40 mg total) by mouth 2 (two) times daily before a meal., Disp: 180 capsule, Rfl: 3   ferrous sulfate 325 (65 FE) MG tablet, Take 325 mg by mouth daily with breakfast., Disp: , Rfl:    levothyroxine (SYNTHROID) 50 MCG tablet, Take 1 tablet (50 mcg total) by mouth daily before breakfast., Disp: 90 tablet, Rfl: 2   loratadine (CLARITIN) 10 MG tablet, Take 10 mg by mouth daily., Disp: , Rfl:    mirabegron ER (MYRBETRIQ) 25 MG TB24 tablet, Take 25 mg by mouth daily., Disp: , Rfl:    Multiple Minerals-Vitamins (CALCIUM CITRATE-MAG-MINERALS PO), Take by mouth., Disp: , Rfl:    Multiple Vitamins-Minerals (PRESERVISION AREDS 2 PO), Take by mouth 2 (two) times a day., Disp: , Rfl:    ramipril (ALTACE) 10 MG capsule, Take 2 capsules (20 mg total) by mouth daily., Disp: 90 capsule, Rfl: 2  Clinical Interview:   Cognitive Symptoms: Decreased short-term memory: Denied. However, Ms. Pucillo's daughter reported ongoing difficulties with generalized forgetfulness, including her mother asking repetitive questions, trouble remembering upcoming appointments, and trouble remembering other family details (e.g., what her granddaughter does for a living). These were said to be present since Ms. Deboy experienced a change in her cognitive status in March - June. Deficits were said to be variable, including good and bad days, since that time.  Decreased long-term memory: Denied. Decreased attention/concentration: Denied. Her daughter reported mild difficulties. Increased ease of distractibility: Denied. Reduced processing speed: Denied. Difficulties with  executive functions: Denied. Her daughter reported some trouble with organization, indecisiveness, and potential impulsivity in the form of asking questions which she already knows  the answer to. Broad personality changes were denied. Difficulties with emotion regulation: Denied. Difficulties with receptive language: Denied. Difficulties with word finding: Denied. Decreased visuoperceptual ability: Denied.  Difficulties completing ADLs: Unclear. While Ms. Rockholt's daughter organizes her medications, her daughter stated that this was as much for her (i.e., feeling comfortable knowing that it has been done and done accurately) as her mother. She did not express direct concerns that Ms. Hur would be unable to perform this task independently. Additionally, Ms. Schowalter's bills are all on autopay. She has not driven for several years; she stopped on her own accord due to symptoms of dizziness and feeling unsafe.   Additional Medical History: History of traumatic brain injury/concussion: Denied. History of stroke: Denied. History of seizure activity: Denied. History of known exposure to toxins: Denied. Symptoms of chronic pain: Endorsed. She reported symptoms of arthritis in her hands, as well as back pain attributed to scoliosis. For the latter, she noted having to wear a back brace since she was 52. She recently got a new brace, which was said to be helpful in alleviating some pain-related symptoms.  Experience of frequent headaches/migraines: Denied. Ms. Fonville did acknowledge a remote history of vascular migraine headaches. However, these have not occurred recently.  Frequent instances of dizziness/vertigo: Denied. She did report instances of dizziness, largely for unknown reasons. However, symptoms were said to occur infrequently.   Sensory changes: Ms. Hudkins has macular degeneration in her left eye, which creates notable visual acuity difficulties. Other sensory changes/difficulties (e.g., hearing, taste, or smell) were denied. Balance/coordination difficulties: Endorsed. She reported mild balance instability concerns, largely attributed to scoliosis. She also noted  stumbling while ambulating in her environment, but is largely able to catch herself. A recent history of falls was denied.  Other motor difficulties: Denied.  Sleep History: Estimated hours obtained each night: Unclear. Ms. Jessie spends on average 8 hours in bed. However, she described her sleep as very broken, making it difficult to know how much time she actually spends asleep. Difficulties falling asleep: Denied. Difficulties staying asleep: Endorsed. She reported waking up frequently throughout the night, generally to use the restroom. Following this, she reported variable trouble falling back asleep. Difficulties were largely attribute to anxiety or having an overactive mind.  Feels rested and refreshed upon awakening: Endorsed.  History of snoring: Very rarely in frequency and mild in nature. History of waking up gasping for air: Denied. Witnessed breath cessation while asleep: Denied.  History of vivid dreaming: Denied. Excessive movement while asleep: Denied. Instances of acting out her dreams: Denied.  Psychiatric/Behavioral Health History: Depression: Denied. Ms. Rifkin described her current mood as "all right" and denied ever being previously diagnosed with any mental health concerns. Likewise, she denied current or remote instances of suicidal ideation, intent, or plan.  Anxiety: Denied. Mania: Denied. Trauma History: Denied. Visual/auditory hallucinations: Denied. Delusional thoughts: Denied. Mental health treatment: Denied.  Tobacco: Denied. Alcohol: Ms. Frase denied current alcohol consumption, as well as a history of problematic alcohol use, abuse, or dependence.  Recreational drugs: Denied. Caffeine: 2 cups of coffee in the morning, as well as a rare caffeinated beverage in the afternoon/evening.   Academic/Vocational History: Highest level of educational attainment: 14 years. Ms. Westover graduated from high school and completed a 2-year nursing degree. 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 Chemistry was described as  a relative weakness.  History of developmental delay: Denied. History of grade repetition: Denied. History of class failures: Denied. Enrollment in special education courses: Denied. History of diagnosed specific learning disability: Denied. History of ADHD: Denied.  Employment: Retired. She previously worked as an Therapist, sports.  Evaluation Results:   Behavioral Observations: Ms. Fansler was accompanied by her daughter, arrived to her appointment on time, and was appropriately dressed and groomed. Observed gait and station were slowed but largely within normal limits. She did ambulate with a cane for added stability. Gross motor functioning appeared intact upon informal observation and no abnormal movements (e.g., tremors) were noted. Her affect was generally relaxed and positive, but did range appropriately given the subject being discussed during the clinical interview or the task at hand during testing procedures. Spontaneous speech was fluent and word finding difficulties were not observed during the clinical interview or testing procedures. Sustained attention was appropriate throughout. Thought processes were coherent, organized, and normal in content. Task engagement was adequate and she persisted when challenged. Vision difficulties appeared to mildly impact tests emphasizing visual scanning (TMT A & B) or object perception (20 Questions). She also had trouble delineating between colors (especially blue and green), causing another test (Color-Word Interference) to be discontinued. Overall, Ms. Paone was cooperative with the clinical interview and subsequent testing procedures.   Adequacy of Effort: The validity of neuropsychological testing is limited by the extent to which the individual being tested may be assumed to have exerted adequate effort during testing. Ms. Leas expressed her intention to perform to the best of her abilities and  exhibited adequate task engagement and persistence. Scores across stand-alone and embedded performance validity measures were within expectation. As such, the results of the current evaluation are believed to be a valid representation of Ms. Garver's current cognitive functioning.  Test Results: Ms. Mathes was somewhat disoriented at the time of the current evaluation. Points were lost for her stating her incorrect age (80; 1/2), being unable to identify the current year (0/2), and being unaware of her current location (0/2).  Intellectual abilities based upon educational and vocational attainment were estimated to be in the average range. Premorbid abilities were estimated to be within the average range based upon a single-word reading test.   Processing speed was variable, ranging from the exceptionally low to average normative ranges. Basic attention was average. More complex attention (e.g., working memory) was average. Cognitive flexibility was well below average, but potentially limited by visual scanning deficits. Nonverbal abstract reasoning was below average. Response inhibition and hypothesis testing/problem solving tasks were discontinued due to ongoing visual disturbances and Ms. Anglemyer being unable to see testing materials effectively.   Assessed receptive language abilities were below average, with increased difficulty noted across more complex sentence structure. Ms. Glasford did not exhibit any difficulties comprehending task instructions and answered all questions asked of her appropriately. Assessed expressive language (e.g., verbal fluency and confrontation naming) was variable. Confrontation naming was average, phonemic fluency was below average, and semantic fluency was well below average.     Assessed visuospatial abilities were below average. Visuoconstructional abilities (i.e., clock drawing) were within normal limits.   Learning (i.e., encoding) of novel verbal and visual  information was within normal limits. Spontaneous delayed recall (i.e., retrieval) of previously learned information was variable. Visual retention was above average, while verbal retention was exceptionally low to well below average and Ms. Shrout was amnestic when trying to recall a story learning task. Performance across a list learning recognition task  was poor overall, suggesting limited evidence for appropriate information consolidation.   Results of emotional screening instruments suggested that recent symptoms of generalized anxiety were in the minimal range, while symptoms of depression were within normal limits. A screening instrument assessing recent sleep quality suggested the presence of minimal sleep dysfunction.  Tables of Scores:   Note: This summary of test scores accompanies the interpretive report and should not be considered in isolation without reference to the appropriate sections in the text. Descriptors are based on appropriate normative data and may be adjusted based on clinical judgment. The terms impaired and within normal limits (WNL) are used when a more specific level of functioning cannot be determined.       Effort Testing:   DESCRIPTOR       Dot Counting Test: --- --- Within Expectation  CVLT-III Forced Choice Recognition: --- --- Within Expectation       Cognitive Screening:          NAB Screening Battery, Form 2 Standard Score/ T Score Percentile   Total Score 85 16 Below Average    Orientation 24/29 --- ---  Attention Domain 86 18 Below Average    Digits Forward 56 73 Average    Digits Backwards 50 50 Average    Letters & Numbers A Efficiency 35 7 Well Below Average    Letters & Numbers B Efficiency 34 5 Well Below Average  Language Domain 89 23 Below Average    Auditory Comprehension 38 12 Below Average    Naming 56 73 Average  Memory Domain 98 45 Average    Shape Learning Immediate Recognition 53 62 Average    Story Learning Immediate Recall 64  7 Well Above Average    Shape Learning Delayed Recognition 57 75 Above Average    Story Learning Delayed Recall 23 <1 Exceptionally Low  Spatial Domain 100 50 Average    Visual Discrimination 61 86 Above Average    Design Construction 41 18 Below Average  Executive Functions Domain 80 9 Below Average    Mazes 40 16 Below Average    Word Generation 39 14 Below Average       Intellectual Functioning:           Standard Score Percentile   Test of Premorbid Functioning: 90 25 Average       Memory:          Engineer, drilling Test (CVLT-III) Brief Form: Raw Score (Scaled/Standard Score) Percentile     Total Trials 1-4 20/36 (90) 25 Average    Short-Delay Free Recall 4/9 (5) 5 Well Below Average    Long-Delay Free Recall 0/9 (1) <1 Exceptionally Low    Long-Delay Cued Recall 3/9 (3) 1 Exceptionally Low      Recognition Hits 9/9 (13) 84 Above Average      False Positive Errors 5 (2) <1 Exceptionally Low       Attention/Executive Function:          Trail Making Test (TMT): Raw Score (T Score) Percentile     Part A 77 secs.,  0 errors (29) 2 Exceptionally Low    Part B 267 secs.,  1 error (30) 2 Well Below Average       D-KEFS Color-Word Interference Test: Raw Score (Scaled Score) Percentile     Color Naming 42 secs. (8) 25 Average    Word Reading 32 secs. (7) 16 Below Average    Inhibition Discontinued --- ---    Inhibition/Switching Discontinued --- ---  D-KEFS 20 Questions Test: Scaled Score Percentile     Total Weighted Achievement Score Discontinued --- ---    Initial Abstraction Score --- --- ---       Language:          Verbal Fluency Test: Raw Score (Z-Score) Percentile     Phonemic Fluency (FAS) 25 (-1.07) 15 Below Average    Animal Fluency 10 (-1.47) 8 Well Below Average  *Based on [employer] Older Normative Studies (MOANS)          NAB Language Module, Form 2: T Score Percentile     Naming 29/31 (54) 66 Average       Visuospatial/Visuoconstruction:       Raw Score Percentile   Clock Drawing: 10/10 --- Within Normal Limits        Scaled Score Percentile   WAIS-IV Matrix Reasoning: 6 9 Below Average       Mood and Personality:      Raw Score Percentile   Geriatric Depression Scale: 8 --- Within Normal Limits  Geriatric Anxiety Scale: 10 --- Minimal    Somatic 6 --- Mild    Cognitive 3 --- Mild    Affective 1 --- Minimal       Additional Questionnaires:      Raw Score Percentile   PROMIS Sleep Disturbance Questionnaire: 19 --- None to Slight   Informed Consent and Coding/Compliance:   Ms. Niland was provided with a verbal description of the nature and purpose of the present neuropsychological evaluation. Also reviewed were the foreseeable risks and/or discomforts and benefits of the procedure, limits of confidentiality, and mandatory reporting requirements of this provider. The patient was given the opportunity to ask questions and receive answers about the evaluation. Oral consent to participate was provided by the patient.   This evaluation was conducted by Christia Reading, Ph.D., licensed clinical neuropsychologist. Ms. Castelo completed a 30-minute clinical interview, billed as one unit [phone], and 125 minutes of cognitive testing, billed as one unit [phone] and three additional units [phone]. Psychometrist Cruzita Lederer, B.S., assisted Dr. Melvyn Novas with test administration and scoring procedures. As a separate and discrete service, Dr. Melvyn Novas spent a total of 180 minutes in interpretation and report writing, billed as one unit 96132 and two units 96133.

## 2019-08-18 NOTE — Progress Notes (Signed)
   Neuropsychology Note   Kerri Simmons completed 110 minutes of neuropsychological testing with technician, Cruzita Lederer, B.S., under the supervision of Dr. Christia Reading, Ph.D., licensed neuropsychologist. The patient did not appear overtly distressed by the testing session, per behavioral observation or via self-report to the technician. Rest breaks were offered.    In considering the patient's current level of functioning, level of presumed impairment, nature of symptoms, emotional and behavioral responses during the interview, level of literacy, and observed level of motivation/effort, a battery of tests was selected and communicated to the psychometrician.   Communication between the psychologist and technician was ongoing throughout the testing session and changes were made as deemed necessary based on patient performance on testing, technician observations and additional pertinent factors such as those listed above.   Kerri Simmons will return within approximately two weeks for an interactive feedback session with Dr. Melvyn Novas at which time his test performances, clinical impressions, and treatment recommendations will be reviewed in detail. The patient understands she can contact our office should she require our assistance before this time.   Full report to follow.  110 minutes were spent face-to-face with patient administering standardized tests and 15 minutes were spent scoring (technician). [CPT [id number], [id number]

## 2019-08-19 DIAGNOSIS — G3184 Mild cognitive impairment, so stated: Secondary | ICD-10-CM | POA: Insufficient documentation

## 2019-08-25 ENCOUNTER — Encounter: Payer: Self-pay | Admitting: Psychology

## 2019-08-25 ENCOUNTER — Ambulatory Visit (INDEPENDENT_AMBULATORY_CARE_PROVIDER_SITE_OTHER): Payer: Medicare Other | Admitting: Psychology

## 2019-08-25 ENCOUNTER — Other Ambulatory Visit: Payer: Self-pay

## 2019-08-25 DIAGNOSIS — G3184 Mild cognitive impairment, so stated: Secondary | ICD-10-CM

## 2019-08-25 NOTE — Progress Notes (Signed)
   Neuropsychology Feedback Session [name]. [facility] Department of Neurology  Reason for Referral:   Kerri Simmons a 85 y.o. Caucasian female referred by Ellouise Newer, M.D.,to characterize hercurrent cognitive functioning and assist with diagnostic clarity and treatment planning in the context of subjective cognitive decline.  Feedback:   [name] completed a comprehensive neuropsychological evaluation on 08/18/2019. Please refer to that encounter for the full report. Briefly, results suggested primary difficulties surrounding cognitive flexibility and information retrieval, including deficits across tasks assessing semantic fluency and verbal learning and memory. Additional variability was seen across tasks assessing processing speed. Overall, given evidence for cognitive dysfunction, coupled with [name]'s report of variable but largely intact ability to complete activities of daily living (ADLs), she meets criteria for a mild neurocognitive disorder (formerly "mild cognitive impairment") at the present time. Recommendations included a repeat evaluation in 12-18 months and strategies for improving day-to-day cognitive difficulties.  [name] was accompanied by her daughter. Her other daughter was present via speakerphone. Content of the current session focused on the results of the evaluation. We also discussed Alzheimer's more in-depth, including genetic risk factors. [name] and her family were given the opportunity to ask questions and their questions were answered. They were also encouraged to reach out should additional questions arise. A copy of her report was provided at the conclusion of the visit.      A total of 25 minutes were spent with [name] during the current feedback session.

## 2019-08-27 ENCOUNTER — Encounter: Payer: Self-pay | Admitting: Family Medicine

## 2019-09-27 ENCOUNTER — Encounter: Payer: Self-pay | Admitting: Family Medicine

## 2019-09-28 ENCOUNTER — Ambulatory Visit (INDEPENDENT_AMBULATORY_CARE_PROVIDER_SITE_OTHER): Payer: Medicare Other | Admitting: Family Medicine

## 2019-09-28 DIAGNOSIS — B029 Zoster without complications: Secondary | ICD-10-CM | POA: Diagnosis not present

## 2019-09-28 MED ORDER — VALACYCLOVIR HCL 1 G PO TABS: 1000.0000 mg | ORAL_TABLET | Freq: Three times a day (TID) | ORAL | 0 refills | Status: AC

## 2019-09-28 MED ORDER — GABAPENTIN 100 MG PO CAPS: 100.0000 mg | ORAL_CAPSULE | Freq: Every day | ORAL | 0 refills | Status: DC

## 2019-09-28 NOTE — Progress Notes (Signed)
   Chief Complaint:  Selenna Hommel is a 85 y.o. female who presents today for a virtual office visit with a chief complaint of rash.   Assessment/Plan:  Rash Consistent with shingles. Start valtrex. Will start low dose gabapentin at night as needed. Recommend OTC lidocaine gel. Dicussed reasons to return to care. Follow up as needed.     Subjective:  HPI:  Rash Started 4 days ago. Located on left lower back. Getting bigger. Painful and itchy. Scabbed over lesions and blisters. Similar to prior shingles episodes. Tried topical steroid with modest improvement. No other treatments tried. No other obvious aggravating or alleviating factors.    ROS: Per HPI  PMH: She reports that she has never smoked. She has never used smokeless tobacco. She reports that she does not drink alcohol or use drugs.      Objective/Observations  Physical Exam: Gen: NAD, resting comfortably Pulm: Normal work of breathing Neuro: Grossly normal, moves all extremities Psych: Normal affect and thought content        Virtual Visit via Video   I connected with Lenay Cavanagh on 09/28/19 at 11:00 AM EST by a video enabled telemedicine application and verified that I am speaking with the correct person using two identifiers. I discussed the limitations of evaluation and management by telemedicine and the availability of in person appointments. The patient expressed understanding and agreed to proceed.   Patient location: Home Provider location: [place] participating in the virtual visit: Myself and Patient     Algis Greenhouse. Jerline Pain, MD 09/28/2019 11:37 AM

## 2019-10-01 ENCOUNTER — Ambulatory Visit: Payer: Self-pay | Admitting: *Deleted

## 2019-10-01 ENCOUNTER — Other Ambulatory Visit: Payer: Self-pay

## 2019-10-01 ENCOUNTER — Telehealth: Payer: Self-pay

## 2019-10-01 NOTE — Telephone Encounter (Signed)
Phone call to office to speak with Dr. Marigene Ehlers nurse. Advised that daughter called and wanted nurse to call her back re: vomiting.  Was advised that nurse will call daughter today.

## 2019-10-01 NOTE — Telephone Encounter (Signed)
See telephone encounter on 10/01/19

## 2019-10-01 NOTE — Telephone Encounter (Signed)
Patient having symptoms of diarrhea and vomiting,started today.Informed patient to eat crackers,toast and drink fluids.Informed if not able to keep food or medication down she needs to go to a urgent care.Her daughter voices understanding.She also is schedule with Saturday clinic. 10/02/19 @ 11:00am

## 2019-10-01 NOTE — Progress Notes (Unsigned)
thanks

## 2019-10-01 NOTE — Telephone Encounter (Signed)
rec'd call from Agent; reported that pt's daughter is calling back to try and speak with office re: concerns of mother vomiting.  Reported she wanted to receive call back from office before they closed.  When Agent was ready to transfer call to nurse, the daughter hung up phone.    Attempted to call daughter back; left vm to call office to discuss mother's symptoms.

## 2019-10-02 ENCOUNTER — Encounter: Payer: Self-pay | Admitting: Family Medicine

## 2019-10-02 ENCOUNTER — Ambulatory Visit (INDEPENDENT_AMBULATORY_CARE_PROVIDER_SITE_OTHER): Payer: Medicare Other | Admitting: Family Medicine

## 2019-10-02 DIAGNOSIS — B029 Zoster without complications: Secondary | ICD-10-CM

## 2019-10-02 DIAGNOSIS — Y92009 Unspecified place in unspecified non-institutional (private) residence as the place of occurrence of the external cause: Secondary | ICD-10-CM | POA: Diagnosis not present

## 2019-10-02 DIAGNOSIS — S0083XA Contusion of other part of head, initial encounter: Secondary | ICD-10-CM

## 2019-10-02 DIAGNOSIS — R1110 Vomiting, unspecified: Secondary | ICD-10-CM

## 2019-10-02 DIAGNOSIS — W19XXXA Unspecified fall, initial encounter: Secondary | ICD-10-CM

## 2019-10-02 DIAGNOSIS — R197 Diarrhea, unspecified: Secondary | ICD-10-CM

## 2019-10-02 NOTE — Progress Notes (Signed)
[facility] [address] [place], [place] [address] [phone] [phone]  Date:  10/02/2019   Name:  Kerri Simmons   DOB:  28-Jun-1926   MRN:  [id number]  PCP:  Vivi Barrack, MD    Chief Complaint: No chief complaint on file.   History of Present Illness:  Kerri Simmons is a 85 y.o. very pleasant female patient who presents with the following:  Patient of Dr Jerline Pain with history of HTN, hypothyroidism, dyslipidemia, glaucoma Virtual visit today for concern of illness Patient is at her daughter's home in [place], provider is at home. Patient identity confirmed with 2 factors, she gives consent for virtual visit today Pt, her daughter and myself are on the video call today  Today is Saturday  She had onset of shingles last week-virtual visit with Dr Jerline Pain on Tuesday 12/8; she received valtrex and gabapentin Fell early this Thursday am-she was out of bed going to the bathroom, tripped and fell She hit her face and has some bruising on her left face-otherwise seem to be okay Yesterday however she began vomiting and then had several bouts of diarrhea   They tried some OTC anti- diarrhea mediation which did help  Today she drank a good amount of liquid, and did eat a little bit of food She just got up about 30 minutes ago, today she seems to be feeling well so far. No vomiting or diarrhea so far today  No headache No vomiting today No fever noted The shingles rash is on her buttock/ labia, seems to be responding to the antiviral; however they did stop it temporarily in case it had triggered her GI upset She is not taking the gabapentin  Her daughter is employed at their local health system in [place] believe she is a Marine scientist   Patient Active Problem List   Diagnosis Date Noted  . Mild neurocognitive disorder 08/19/2019  . Orthostasis 04/19/2019  . Essential hypertension 04/19/2019  . Hypothyroidism 04/19/2019  .  Overactive bladder 04/19/2019  . IBS (irritable bowel syndrome) 04/19/2019  . GERD (gastroesophageal reflux disease) 04/19/2019  . Dyslipidemia 04/19/2019  . Glaucoma 04/19/2019  . Macular degeneration, left eye 10/21/2008    Past Medical History:  Diagnosis Date  . Allergy   . Arthritis   . Cancer ([facility])   . Essential hypertension 04/19/2019  . GERD (gastroesophageal reflux disease) 04/19/2019  . Glaucoma 04/19/2019  . Hyperlipidemia   . Hypothyroidism 04/19/2019  . IBS (irritable bowel syndrome) 04/19/2019  . Macular degeneration, left eye 10/21/2008  . Migraines   . Orthostasis 04/19/2019  . Pneumonia   . Thyroid disease     Past Surgical History:  Procedure Laterality Date  . ABDOMINAL HYSTERECTOMY    . FETAL BLOOD TRANSFUSION      Social History   Tobacco Use  . Smoking status: Never Smoker  . Smokeless tobacco: Never Used  Substance Use Topics  . Alcohol use: Never  . Drug use: Never    Family History  Problem Relation Age of Onset  . Dementia Father   . Dementia Brother        Vascular    Allergies  Allergen Reactions  . Sulfa Antibiotics     Medication list has been reviewed and updated.  Current Outpatient Medications on File Prior to Visit  Medication Sig Dispense Refill  . azelastine (ASTELIN) 0.1 % nasal spray Place 2 sprays into both nostrils 2 (two) times daily.  30 mL 12  . azithromycin (ZITHROMAX) 250 MG tablet Take 2 tabs day 1, then 1 tab daily 6 each 0  . Carboxymethylcellulose Sodium (EYE DROPS OP) Apply to eye.    . cycloSPORINE (RESTASIS OP) Apply to eye.    . escitalopram (LEXAPRO) 5 MG tablet Take 1 tablet (5 mg total) by mouth daily. 30 tablet 11  . esomeprazole (NEXIUM) 40 MG capsule Take 1 capsule (40 mg total) by mouth 2 (two) times daily before a meal. 180 capsule 3  . ferrous sulfate 325 (65 FE) MG tablet Take 325 mg by mouth daily with breakfast.    . gabapentin (NEURONTIN) 100 MG capsule Take 1 capsule (100 mg total) by mouth at  bedtime. 30 capsule 0  . levothyroxine (SYNTHROID) 50 MCG tablet Take 1 tablet (50 mcg total) by mouth daily before breakfast. 90 tablet 2  . loratadine (CLARITIN) 10 MG tablet Take 10 mg by mouth daily.    . mirabegron ER (MYRBETRIQ) 25 MG TB24 tablet Take 25 mg by mouth daily.    . Multiple Minerals-Vitamins (CALCIUM CITRATE-MAG-MINERALS PO) Take by mouth.    . Multiple Vitamins-Minerals (PRESERVISION AREDS 2 PO) Take by mouth 2 (two) times a day.    . ramipril (ALTACE) 10 MG capsule Take 2 capsules (20 mg total) by mouth daily. 90 capsule 2  . valACYclovir (VALTREX) 1000 MG tablet Take 1 tablet (1,000 mg total) by mouth 3 (three) times daily for 7 days. Take for 10 days 21 tablet 0   No current facility-administered medications on file prior to visit.    Review of Systems:  As per HPI- otherwise negative.   Physical Examination: There were no vitals filed for this visit. There were no vitals filed for this visit. There is no height or weight on file to calculate BMI. Ideal Body Weight:    Patient observed over video today. She looks well and does not appear ill, appears stated age No cough, wheezing, shortness of breath is noted Mild bruising is present over her left cheek No battle sign or raccoon eyes visible They are not checking vital signs at home Assessment and Plan: Fall in home, initial encounter  Herpes zoster without complication  Vomiting and diarrhea   I discussed situation in detail with patient and her daughter. This patient was recently treated for shingles with Valtrex, she then 2 days ago suffered a fall in the night and hit her head. Yesterday, the day after her fall, she had vomiting and diarrhea. Today she is feeling better, no GI symptoms so far no headache Advised patient that Valtrex is unlikely to have triggered her GI symptoms, she may try taking this again if she would like We discussed possibility of COVID-19. Patient and family feel that her  exposure risk is low and that this is unlikely We also discussed vomiting after head trauma in an elderly person. This may indicate some intracranial bleed which can only be ruled out with imaging I recommended taking the patient to the emergency department for further evaluation as my most conservative advice. If they do not wish to do this, they might also be able to use an urgent care that could order an outpatient CT. Given her age and current pandemic, her daughter wonders about observing her at home. We discussed this option in detail. As she does seem to be feeling better, it would also be possible to observe her closely although this does not rule out the possibility of intracranial bleed. If  they do choose this option, they should seek care immediately if vomiting returns or she shows any other sign of confusion, severe headache, neurological change  Invited and answered all questions from patient and her daughter Signed Lamar Blinks, MD

## 2019-10-26 ENCOUNTER — Encounter: Payer: Self-pay | Admitting: Family Medicine

## 2019-11-05 ENCOUNTER — Ambulatory Visit: Payer: Medicare Other | Attending: Internal Medicine

## 2019-11-05 DIAGNOSIS — Z23 Encounter for immunization: Secondary | ICD-10-CM | POA: Insufficient documentation

## 2019-11-05 NOTE — Progress Notes (Signed)
   Covid-19 Vaccination Clinic  Name:  Kerri Simmons    MRN: [id number] DOB: 02/14/1926  11/05/2019  Kerri Simmons was observed post Covid-19 immunization for 15 minutes without incidence. She was provided with Vaccine Information Sheet and instruction to access the V-Safe system.   Kerri Simmons was instructed to call 911 with any severe reactions post vaccine: Marland Kitchen Difficulty breathing  . Swelling of your face and throat  . A fast heartbeat  . A bad rash all over your body  . Dizziness and weakness    Immunizations Administered    Name Date Dose VIS Date Route   Pfizer COVID-19 Vaccine 11/05/2019 11:04 AM 0.3 mL 10/01/2019 Intramuscular   Manufacturer: [place]   Lot: [id number]   [place]: [id number]

## 2019-11-08 ENCOUNTER — Other Ambulatory Visit: Payer: Self-pay

## 2019-11-08 ENCOUNTER — Telehealth: Payer: Self-pay | Admitting: Family Medicine

## 2019-11-08 MED ORDER — MIRABEGRON ER 25 MG PO TB24: 25.0000 mg | ORAL_TABLET | Freq: Every day | ORAL | 2 refills | Status: DC

## 2019-11-08 MED ORDER — RAMIPRIL 10 MG PO CAPS: 20.0000 mg | ORAL_CAPSULE | Freq: Every day | ORAL | 2 refills | Status: DC

## 2019-11-08 NOTE — Telephone Encounter (Signed)
Rx sent 

## 2019-11-08 NOTE — Telephone Encounter (Signed)
MEDICATION: Myrbetriq & Ramipril  PHARMACY: [facility]  **Let patient know to contact pharmacy at the end of the day to make sure medication is ready. **  ** Please notify patient to allow 48-72 hours to process**  **Encourage patient to contact the pharmacy for refills or they can request refills through [facility]**  Comments:   Pt usually gets these medications through [facility]. Pt needs refills and Dr. Jerline Pain has not prescribed these since pt has seen him. Please give pt daughter a call.

## 2019-11-10 ENCOUNTER — Telehealth: Payer: Self-pay | Admitting: Family Medicine

## 2019-11-10 NOTE — Telephone Encounter (Signed)
[facility] Nonclinical Telephone Record [facility] [place] [facility] [place] Night Physician Dimas Chyle- MD Contact Type Call Who Is Calling Physician / Provider / Hospital Call Type Provider Call Message Only Reason for Call Request to send message to Office Initial [place] would like a call back tomorrow about a new RX for Pt. Pt Kerri Simmons 1926/08/23. Call back # is [phone]

## 2019-11-11 ENCOUNTER — Other Ambulatory Visit: Payer: Self-pay

## 2019-11-11 NOTE — Telephone Encounter (Signed)
Spoke with pharmacy unknown what call is regarding.

## 2019-11-16 ENCOUNTER — Ambulatory Visit (INDEPENDENT_AMBULATORY_CARE_PROVIDER_SITE_OTHER): Payer: Medicare Other | Admitting: Family Medicine

## 2019-11-16 ENCOUNTER — Encounter: Payer: Self-pay | Admitting: Family Medicine

## 2019-11-16 ENCOUNTER — Other Ambulatory Visit: Payer: Self-pay

## 2019-11-16 VITALS — BP 142/58 | HR 58 | Temp 97.2°F | Ht 61.5 in | Wt 109.2 lb

## 2019-11-16 DIAGNOSIS — I10 Essential (primary) hypertension: Secondary | ICD-10-CM | POA: Diagnosis not present

## 2019-11-16 LAB — COMPREHENSIVE METABOLIC PANEL
ALT: 13 U/L (ref 0–35)
AST: 17 U/L (ref 0–37)
Albumin: 4.3 g/dL (ref 3.5–5.2)
Alkaline Phosphatase: 73 U/L (ref 39–117)
BUN: 20 mg/dL (ref 6–23)
CO2: 27 mEq/L (ref 19–32)
Calcium: 9.9 mg/dL (ref 8.4–10.5)
Chloride: 104 mEq/L (ref 96–112)
Creatinine, Ser: 0.99 mg/dL (ref 0.40–1.20)
GFR: 52.53 mL/min — ABNORMAL LOW (ref >60.00)
Glucose, Bld: 136 mg/dL — ABNORMAL HIGH (ref 70–99)
Potassium: 4.5 mEq/L (ref 3.5–5.1)
Sodium: 138 mEq/L (ref 135–145)
Total Bilirubin: 0.7 mg/dL (ref 0.2–1.2)
Total Protein: 6.6 g/dL (ref 6.0–8.3)

## 2019-11-16 LAB — CBC
HCT: 37.9 % (ref 36.0–46.0)
Hemoglobin: 12.5 g/dL (ref 12.0–15.0)
MCHC: 33.1 g/dL (ref 30.0–36.0)
MCV: 97.2 fl (ref 78.0–100.0)
Platelets: 106 10*3/uL — ABNORMAL LOW (ref 150.0–400.0)
RBC: 3.9 Mil/uL (ref 3.87–5.11)
RDW: 15.2 % (ref 11.5–15.5)
WBC: 5 10*3/uL (ref 4.0–10.5)

## 2019-11-16 LAB — TSH: TSH: 2.61 u[IU]/mL (ref 0.35–4.50)

## 2019-11-16 MED ORDER — ONDANSETRON 8 MG PO TBDP: 8.0000 mg | ORAL_TABLET | Freq: Three times a day (TID) | ORAL | 0 refills | Status: AC | PRN

## 2019-11-16 MED ORDER — RAMIPRIL 10 MG PO CAPS: 10.0000 mg | ORAL_CAPSULE | Freq: Every day | ORAL | 2 refills | Status: DC

## 2019-11-16 NOTE — Progress Notes (Signed)
   Kerri Simmons is a 86 y.o. female who presents today for an office visit.  Assessment/Plan:  New/Acute Problems: Diarrhea/Vomiting No current symptoms.  Unclear etiology.  Will check labs today including CBC, C met, TSH.  If continues to have recurrences will need referral to GI.  Will send in Zofran to use as needed for nausea.  Discussed importance of good oral hydration.  Chronic Problems Addressed Today: Essential hypertension Low diastolic blood pressure today.  We will switch ramipril to 10 mg daily from 20 mg.  Will check CBC, C met, TSH.  Continue home blood pressure monitoring.  I will have some improvement in her fatigue/lethargy and orthostasis with decrease dose of antihypertensives.    Subjective:  HPI:  Patient here today with her daughter.  Patient had an episode of diarrhea and vomiting 2 days ago.  She became very lethargic during this and became unresponsive.  EMS was called and evaluated patient.  EKG performed which was reportedly normal.  She has not had any episodes of diarrhea or vomiting over the last couple of days.  She took Imodium 2 days ago which is helped.  No noted hematemesis or hematochezia.  No melena.  She had something similar happened about a month ago.  No sick contacts.  Appetite has been good since the episode.  Blood pressure is also been very labile.  They have seen readings as low as 100/50 and as high as 160/60.  Patient has been feeling more fatigued for the past several days.       Objective:  Physical Exam: BP (!) 142/58   Pulse (!) 58   Temp (!) 97.2 F (36.2 C)   Ht 5' 1.5" (1.562 m)   Wt 109 lb 4 oz (49.6 kg)   SpO2 (!) 87%   BMI 20.31 kg/m   Wt Readings from Last 3 Encounters:  11/16/19 109 lb 4 oz (49.6 kg)  05/10/19 112 lb (50.8 kg)  04/19/19 112 lb 4 oz (50.9 kg)  Gen: No acute distress, resting comfortably CV: Regular rate and rhythm with no murmurs appreciated Pulm: Normal work of breathing, clear to auscultation  bilaterally with no crackles, wheezes, or rhonchi Neuro: Grossly normal, moves all extremities Psych: Normal affect and thought content      Magdelyn Roebuck M. Jerline Pain, MD 11/16/2019 11:21 AM

## 2019-11-16 NOTE — Assessment & Plan Note (Signed)
Low diastolic blood pressure today.  We will switch ramipril to 10 mg daily from 20 mg.  Will check CBC, C met, TSH.  Continue home blood pressure monitoring.  I will have some improvement in her fatigue/lethargy and orthostasis with decrease dose of antihypertensives.

## 2019-11-16 NOTE — Patient Instructions (Signed)
It was very nice to see you today!  We will check blood work today.  Please decrease her blood pressure medication to 10 mg daily.  Please use the Zofran if needed.  We may need to get her in to see a GI specialist if her symptoms persist.  Take care, Dr Jerline Pain  Please try these tips to maintain a healthy lifestyle:    Eat at least 3 REAL meals and 1-2 snacks per day.  Aim for no more than 5 hours between eating.  If you eat breakfast, please do so within one hour of getting up.    Each meal should contain half fruits/vegetables, one quarter protein, and one quarter carbs (no bigger than a computer mouse)   Cut down on sweet beverages. This includes juice, soda, and sweet tea.     Drink at least 1 glass of water with each meal and aim for at least 8 glasses per day   Exercise at least 150 minutes every week.

## 2019-11-17 NOTE — Progress Notes (Signed)
Please inform patient of the following:  All of her blood work is NORMAL. No clear explanation for her symptoms. AS we discussed at her visit, given her recurrence we can refer her to see a GI specialist. Please place referral if patient/family is interested.

## 2019-11-24 ENCOUNTER — Encounter: Payer: Self-pay | Admitting: Gastroenterology

## 2019-11-24 ENCOUNTER — Other Ambulatory Visit: Payer: Self-pay

## 2019-11-24 ENCOUNTER — Telehealth: Payer: Self-pay | Admitting: Family Medicine

## 2019-11-24 DIAGNOSIS — K580 Irritable bowel syndrome with diarrhea: Secondary | ICD-10-CM

## 2019-11-24 NOTE — Telephone Encounter (Signed)
Notified patient daughter Dalene Seltzer of message she voices understanding,informed to try a bland diet and Pedialyte if symptoms worsen call the office to schedule a virtual or see what Dr.parkers recommends.  Symptoms has subsided will call if needed. FYI

## 2019-11-24 NOTE — Telephone Encounter (Signed)
If she is having active or extreme diarrhea or vomiting then would recommend she hold off on vaccine.  Algis Greenhouse. Jerline Pain, MD 11/24/2019 11:54 AM

## 2019-11-24 NOTE — Telephone Encounter (Signed)
Patient stated diarrhea was extreme and she is vomiting do you think it is ok to receive vaccine.

## 2019-11-24 NOTE — Telephone Encounter (Signed)
Daughter is calling in this morning saying that her mother is still having some gastro issues and would like to know if they can get a referral to somewhere to help with the diarrhea, throwing up and the extreme fatigue. Kerri Simmons also would like to know if Dr.Parker still recommends Kerri Simmons to get her second covid shot tomorrow, even with her symptoms. Patient's daughter would like a phone call if any questions or concerns.

## 2019-11-25 ENCOUNTER — Ambulatory Visit: Payer: Medicare Other | Attending: Internal Medicine

## 2019-11-25 DIAGNOSIS — Z23 Encounter for immunization: Secondary | ICD-10-CM | POA: Insufficient documentation

## 2019-11-25 NOTE — Progress Notes (Signed)
   Covid-19 Vaccination Clinic  Name:  Kerri Simmons    MRN: [id number] DOB: 29-Sep-1926  11/25/2019  [name] was observed post Covid-19 immunization for 15 minutes without incidence. She was provided with Vaccine Information Sheet and instruction to access the V-Safe system.   [name] was instructed to call 911 with any severe reactions post vaccine: Marland Kitchen Difficulty breathing  . Swelling of your face and throat  . A fast heartbeat  . A bad rash all over your body  . Dizziness and weakness    Immunizations Administered    Name Date Dose VIS Date Route   Pfizer COVID-19 Vaccine 11/25/2019 12:50 PM 0.3 mL 10/01/2019 Intramuscular   Manufacturer: [place]   Lot: [id number]   [place]: [id number]

## 2019-11-25 NOTE — Telephone Encounter (Signed)
Noted.  Algis Greenhouse. Jerline Pain, MD 11/25/2019 8:05 AM

## 2019-12-02 ENCOUNTER — Ambulatory Visit (INDEPENDENT_AMBULATORY_CARE_PROVIDER_SITE_OTHER): Payer: Medicare Other | Admitting: Gastroenterology

## 2019-12-02 ENCOUNTER — Other Ambulatory Visit (INDEPENDENT_AMBULATORY_CARE_PROVIDER_SITE_OTHER): Payer: Medicare Other

## 2019-12-02 ENCOUNTER — Encounter: Payer: Self-pay | Admitting: Gastroenterology

## 2019-12-02 DIAGNOSIS — R197 Diarrhea, unspecified: Secondary | ICD-10-CM

## 2019-12-02 DIAGNOSIS — R112 Nausea with vomiting, unspecified: Secondary | ICD-10-CM

## 2019-12-02 LAB — COMPREHENSIVE METABOLIC PANEL
ALT: 12 U/L (ref 0–35)
AST: 18 U/L (ref 0–37)
Albumin: 4.1 g/dL (ref 3.5–5.2)
Alkaline Phosphatase: 66 U/L (ref 39–117)
BUN: 24 mg/dL — ABNORMAL HIGH (ref 6–23)
CO2: 25 mEq/L (ref 19–32)
Calcium: 9.3 mg/dL (ref 8.4–10.5)
Chloride: 105 mEq/L (ref 96–112)
Creatinine, Ser: 0.93 mg/dL (ref 0.40–1.20)
GFR: 56.45 mL/min — ABNORMAL LOW (ref >60.00)
Glucose, Bld: 105 mg/dL — ABNORMAL HIGH (ref 70–99)
Potassium: 4.3 mEq/L (ref 3.5–5.1)
Sodium: 136 mEq/L (ref 135–145)
Total Bilirubin: 0.4 mg/dL (ref 0.2–1.2)
Total Protein: 6.5 g/dL (ref 6.0–8.3)

## 2019-12-02 LAB — AMYLASE: Amylase: 62 U/L (ref 27–131)

## 2019-12-02 LAB — LIPASE: Lipase: 27 U/L (ref 11.0–59.0)

## 2019-12-02 NOTE — Progress Notes (Addendum)
Referring Provider: Vivi Barrack, MD Primary Care Physician:  Vivi Barrack, MD  Reason for Consultation:  Intermittent GI symptoms   IMPRESSION:  Acute, recurrent onset of nausea, vomiting, acute diarrhea with syncope    - normal liver enzymes Diagnosis of IBS made in [place] 5 years ago Thrombocytopenia Hyperglycemia    - serum glucose 136 11/16/19  Given the temporal association of GI symptoms with syncopal events and labile blood pressure, must consider non-GI etiologies to her symptoms.    Will proceed with screening today for dehydration and electrolyte abnormalities. Recent liver enzymes and CBC were normal except for platelets of 106.  Could her hyperglcemia, albeit it mild, be contributing? Labs and imaging to evaluate her pancreas, liver, and biliary tree and to screen for intraabdominal masses.   The patient and her daughter would like to avoid endoscopic evaluation due to the potential risks.   PLAN: Lipase, amylase CMP in preparation for CT CT abd/pelvis with contrast Obtain records from [facility] in 4 weeks, earlier as needed  I spent 60 minutes of time, including in depth chart review, communicating results with the patient directly, face-to-face time with the patient and her daughter, coordinating care, ordering studies and medications as appropriate, and documentation.    HPI: Kerri Simmons is a 86 y.o. female referred by Dr. Jerline Pain for nausea, vomiting. Her daughter, with whom he lives, accompanies her to this appointment and provides most of the history.  Kerri Simmons has hypertension, intermittent anemia, arthritis, thyroid disease, hypercholesterolemia and cognitive impairment. 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   History of prior evaluation with GI while hospitalized in [place] 5 years ago. Studies, results, and diagnoses are unknown except for  treatment with Nexium and  dicyclomine for "IBS."   Had an episode of severe nausea, vomiting while visiting her daughter 10/01/2019.   Similar episodes 11/14/2019 and 11/23/2019. Started with non-bloody vomiting. Followed by diarrhea. Ultimately had a syncopal event. By the time EMS arrived she was alert and aware. EKG at the time was reportedly normal. She did not go to the hospital because she was feeling better. No chest pain or shortness of breath. No lightheadedness or dizziness.  Daughter gave her one Imodium. She also tried SL Nitroglycerin but this did not prevent any vomiting.   Last week she had an upset stomach and terrible morning diarrhea. Diarrhea is frequently followed by constipation.  No further episodes.   Her daughter is concerned that the episodes may be driving by dehydration. The patient is worried about drinking the amount of water that her daughter has encouraged her to drink. She finds that she wants to vomit when she over drinks water.  No known sick contacts, dietary changes, recent antibiotics. No blood or mucous in the stool. No fevers, chills, or night sweats. Good appetite. No NSAIDs.  Unspecified weight loss.  She started wearing a back brace in November. Initially thought that maybe the back brace is related. But, adjusting the brace hasn't change her symptoms.   Blood pressure is also been very labile. Rampiril recently reduced to one daily. No new medications.  Head CT without contrast 05/25/19: no acute changes; chronic microvascular angiopathy changes and parenchymal volume loss  Labs 11/16/19: Normal TSH, Normal CMP except for glucose of 136, normal CBC except for platelets of 106  No known family history of colon cancer or polyps. No family history of uterine/endometrial cancer, pancreatic cancer  or gastric/stomach cancer.   Past Medical History:  Diagnosis Date  . Allergy   . Arthritis   . Cancer ([place])   . Essential hypertension 04/19/2019  . GERD (gastroesophageal reflux disease)  04/19/2019  . Glaucoma 04/19/2019  . Hyperlipidemia   . Hypothyroidism 04/19/2019  . IBS (irritable bowel syndrome) 04/19/2019  . Macular degeneration, left eye 10/21/2008  . Migraines   . Orthostasis 04/19/2019  . Pneumonia   . Thyroid disease     Past Surgical History:  Procedure Laterality Date  . ABDOMINAL HYSTERECTOMY    . FETAL BLOOD TRANSFUSION    . SQUAMOUS CELL CARCINOMA EXCISION Left    upper left arm  . THYROID LOBECTOMY      Current Outpatient Medications  Medication Sig Dispense Refill  . azelastine (ASTELIN) 0.1 % nasal spray Place 2 sprays into both nostrils 2 (two) times daily. 30 mL 12  . Carboxymethylcellulose Sodium (EYE DROPS OP) Apply to eye.    . cycloSPORINE (RESTASIS OP) Apply to eye.    . esomeprazole (NEXIUM) 40 MG capsule Take 1 capsule (40 mg total) by mouth 2 (two) times daily before a meal. 180 capsule 3  . ferrous sulfate 325 (65 FE) MG tablet Take 325 mg by mouth daily with breakfast.    . levothyroxine (SYNTHROID) 50 MCG tablet Take 1 tablet (50 mcg total) by mouth daily before breakfast. 90 tablet 2  . loratadine (CLARITIN) 10 MG tablet Take 10 mg by mouth daily.    . mirabegron ER (MYRBETRIQ) 25 MG TB24 tablet Take 1 tablet (25 mg total) by mouth daily. 90 tablet 2  . Multiple Minerals-Vitamins (CALCIUM CITRATE-MAG-MINERALS PO) Take by mouth.    . Multiple Vitamins-Minerals (PRESERVISION AREDS 2 PO) Take by mouth 2 (two) times a day.    . ondansetron (ZOFRAN ODT) 8 MG disintegrating tablet Take 1 tablet (8 mg total) by mouth every 8 (eight) hours as needed for nausea or vomiting. 20 tablet 0  . ramipril (ALTACE) 10 MG capsule Take 1 capsule (10 mg total) by mouth daily. 180 capsule 2   No current facility-administered medications for this visit.    Allergies as of 12/02/2019 - Review Complete 12/02/2019  Allergen Reaction Noted  . Sulfa antibiotics  04/19/2019    Family History  Problem Relation Age of Onset  . Dementia Father   . Congenital  heart disease Father   . Dementia Brother        Vascular  . Heart disease Brother   . Diabetes Mother   . Heart disease Mother   . Diabetes Sister   . Heart disease Sister   . Diabetes Sister   . Colon cancer Neg Hx   . Esophageal cancer Neg Hx   . Stomach cancer Neg Hx   . Pancreatic cancer Neg Hx     Social History   Socioeconomic History  . Marital status: Widowed    Spouse name: Not on file  . Number of children: Not on file  . Years of education: 43  . Highest education level: Not on file  Occupational History  . Not on file  Tobacco Use  . Smoking status: Never Smoker  . Smokeless tobacco: Never Used  Substance and Sexual Activity  . Alcohol use: Never  . Drug use: Never  . Sexual activity: Not Currently  Other Topics Concern  . Not on file  Social History Narrative   Lives with daughter at this time      Right handed  Retired Therapist, sports   Social Determinants of Radio broadcast assistant Strain:   . Difficulty of Paying Living Expenses: Not on file  Food Insecurity:   . Worried About Charity fundraiser in the Last Year: Not on file  . Ran Out of Food in the Last Year: Not on file  Transportation Needs:   . Lack of Transportation (Medical): Not on file  . Lack of Transportation (Non-Medical): Not on file  Physical Activity:   . Days of Exercise per Week: Not on file  . Minutes of Exercise per Session: Not on file  Stress:   . Feeling of Stress : Not on file  Social Connections:   . Frequency of Communication with Friends and Family: Not on file  . Frequency of Social Gatherings with Friends and Family: Not on file  . Attends Religious Services: Not on file  . Active Member of Clubs or Organizations: Not on file  . Attends Archivist Meetings: Not on file  . Marital Status: Not on file  Intimate Partner Violence:   . Fear of Current or Ex-Partner: Not on file  . Emotionally Abused: Not on file  . Physically Abused: Not on file  .  Sexually Abused: Not on file    Review of Systems: 12 system ROS is negative except as noted above with the addition of arthritis in her hands.   Physical Exam: General:   Alert,  well-nourished, pleasant and cooperative in NAD Head:  Normocephalic and atraumatic. Eyes:  Sclera clear, no icterus.   Conjunctiva pink. Ears:  Normal auditory acuity. Nose:  No deformity, discharge,  or lesions. Mouth:  No deformity or lesions.   Neck:  Supple; no masses or thyromegaly. Lungs:  Clear throughout to auscultation.   No wheezes. Heart:  Regular rate and rhythm; no murmurs. Abdomen:  Soft,nontender, nondistended, normal bowel sounds, no rebound or guarding. No hepatosplenomegaly.   Rectal:  Deferred  Msk:  Symmetrical. No boney deformities LAD: No inguinal or umbilical LAD Extremities:  No clubbing or edema. Neurologic:  Alert and  oriented x4;  grossly nonfocal Skin:  Intact without significant lesions or rashes. Psych:  Alert and cooperative. Normal mood and affect.     Sahory Nordling L. Tarri Glenn, MD, MPH 12/02/2019, 2:56 PM

## 2019-12-02 NOTE — Patient Instructions (Signed)
Your provider has requested that you go to the basement level for lab work before leaving today. Press "B" on the elevator. The lab is located at the first door on the left as you exit the elevator.  You have been scheduled for a CT scan of the abdomen and pelvis at [facility], 1st floor Radiology.  You are scheduled on 12-10-19 at 12:30 pm. You should arrive 15 minutes prior to your appointment time for registration. Please follow the written instructions below on the day of your exam:  WARNING: IF YOU ARE ALLERGIC TO IODINE/X-RAY DYE, PLEASE NOTIFY RADIOLOGY IMMEDIATELY AT [phone]! YOU WILL BE GIVEN A 13 HOUR PREMEDICATION PREP.  1) Do not eat anything after 8:30 am (4 hours prior to your test) 2) You have been given 2 bottles of oral contrast to drink. The solution may taste better if refrigerated, but do NOT add ice or any other liquid to this solution. Shake well before drinking.    Drink 1 bottle of contrast @ 10:30 am (2 hours prior to your exam)  Drink 1 bottle of contrast @ 11:30 am (1 hour prior to your exam)  You may take any medications as prescribed with a small amount of water, if necessary. If you take any of the following medications: METFORMIN, GLUCOPHAGE, GLUCOVANCE, AVANDAMET, RIOMET, FORTAMET, Rolfe MET, JANUMET, GLUMETZA or METAGLIP, you MAY be asked to HOLD this medication 48 hours AFTER the exam.  The purpose of you drinking the oral contrast is to aid in the visualization of your intestinal tract. The contrast solution may cause some diarrhea. Depending on your individual set of symptoms, you may also receive an intravenous injection of x-ray contrast/dye. Plan on being at [facility] for 30 minutes or longer, depending on the type of exam you are having performed.  This test typically takes 30-45 minutes to complete.  If you have any questions regarding your exam or if you need to reschedule, you may call [phone] between the hours of 8:00 am and  5:00 pm, Monday-Friday.  ________________________________________________________________________

## 2019-12-07 ENCOUNTER — Telehealth: Payer: Self-pay | Admitting: Gastroenterology

## 2019-12-07 NOTE — Telephone Encounter (Signed)
ROI faxed to Dr. Zenia Resides 2/16/21fbg

## 2019-12-10 ENCOUNTER — Ambulatory Visit (HOSPITAL_COMMUNITY)
Admission: RE | Admit: 2019-12-10 | Discharge: 2019-12-10 | Disposition: A | Discharge status: Home / Self Care | Payer: Medicare Other | Source: Ambulatory Visit | Attending: Gastroenterology | Admitting: Gastroenterology

## 2019-12-10 ENCOUNTER — Telehealth: Payer: Self-pay | Admitting: Gastroenterology

## 2019-12-10 ENCOUNTER — Other Ambulatory Visit: Payer: Self-pay

## 2019-12-10 DIAGNOSIS — R112 Nausea with vomiting, unspecified: Secondary | ICD-10-CM | POA: Diagnosis present

## 2019-12-10 DIAGNOSIS — R197 Diarrhea, unspecified: Secondary | ICD-10-CM | POA: Diagnosis present

## 2019-12-10 MED ORDER — SODIUM CHLORIDE (PF) 0.9 % IJ SOLN
INTRAMUSCULAR | Status: AC
  Filled 2019-12-10: qty 50

## 2019-12-10 MED ORDER — IOHEXOL 300 MG/ML  SOLN
75.0000 mL | Freq: Once | INTRAMUSCULAR | Status: AC | PRN
  Administered 2019-12-10: 13:00:00 75 mL via INTRAVENOUS

## 2019-12-10 NOTE — Telephone Encounter (Signed)
Returned call to daughter of patient and let her know the diarrhea and abdominal pain after the CT is likely related to the oral contrast. If not better by Monday, to please call us back

## 2019-12-13 NOTE — Telephone Encounter (Signed)
Recv'd medical records from [facility] forwarded 21 pages to [facility].  2/22/21fbg

## 2019-12-14 ENCOUNTER — Other Ambulatory Visit: Payer: Self-pay

## 2019-12-14 ENCOUNTER — Telehealth: Payer: Self-pay | Admitting: Family Medicine

## 2019-12-14 NOTE — Telephone Encounter (Signed)
Rx approved Nexium From 12/15/2019-12-13-2020

## 2019-12-14 NOTE — Telephone Encounter (Signed)
  LAST APPOINTMENT DATE: 11/24/2019   NEXT APPOINTMENT DATE:@Visit  date not found  MEDICATION:esomeprazole (NEXIUM) 40 MG capsule  PHARMACY:[facility], [place] AT Portal to Registered [employer] Sites  COMMENT: Patients daughter states that Tommie Sams has called about this needing a verification from [facility] only has a weeks worth of medication left. Asked for a returned phone call if any problems.   **Let patient know to contact pharmacy at the end of the day to make sure medication is ready. **  ** Please notify patient to allow 48-72 hours to process**  **Encourage patient to contact the pharmacy for refills or they can request refills through [facility]**  CLINICAL FILLS OUT ALL BELOW:   LAST REFILL:  QTY:  REFILL DATE:    OTHER COMMENTS:    Okay for refill?  Please advise

## 2019-12-27 ENCOUNTER — Ambulatory Visit (INDEPENDENT_AMBULATORY_CARE_PROVIDER_SITE_OTHER): Payer: Medicare Other | Admitting: Neurology

## 2019-12-27 ENCOUNTER — Encounter: Payer: Self-pay | Admitting: Neurology

## 2019-12-27 ENCOUNTER — Other Ambulatory Visit: Payer: Self-pay

## 2019-12-27 VITALS — Ht 62.0 in | Wt 112.0 lb

## 2019-12-27 DIAGNOSIS — F0390 Unspecified dementia without behavioral disturbance: Secondary | ICD-10-CM

## 2019-12-27 DIAGNOSIS — F03A0 Unspecified dementia, mild, without behavioral disturbance, psychotic disturbance, mood disturbance, and anxiety: Secondary | ICD-10-CM

## 2019-12-27 MED ORDER — RIVASTIGMINE TARTRATE 1.5 MG PO CAPS: 1.5000 mg | ORAL_CAPSULE | Freq: Two times a day (BID) | ORAL | 1 refills | Status: DC

## 2019-12-27 NOTE — Progress Notes (Signed)
Telephone (Audio) Visit The purpose of this telephone visit is to provide medical care while limiting exposure to the novel coronavirus.    Consent was obtained for telephone visit:  Yes.   Answered questions that patient had about telehealth interaction:  Yes.   I discussed the limitations, risks, security and privacy concerns of performing an evaluation and management service by telephone. I also discussed with the patient that there may be a patient responsible charge related to this service. The patient expressed understanding and agreed to proceed.  Pt location: Home Physician Location: office Name of referring provider:  Vivi Barrack, MD I connected with .Kerri Simmons at patients initiation/request on 12/27/2019 at  3:00 PM EST by telephone and verified that I am speaking with the correct person using two identifiers.  Pt MRN:  [id number] Pt DOB:  06/19/1926   History of Present Illness:  The patient had a telephone visit on 12/27/2019. She was last seen in the neurology clinic 8 months ago for memory loss. Her daughter Kerri Simmons and Kerri Simmons are present as well during the visit to provide additional information. Records were reviewed. I personally reviewed head CT without contrast done 05/25/2019 which did not show any acute changes, there was moderate diffuse volume loss and chronic microvascular disease. She underwent Neuropsychological testing in October 2020 which showed primary difficulties surrounding cognitive flexibility and information retrieval, semantic fluency and verbal learning and memory. Since she was completing ADLs fine, she met criteria for Mild Neurocognitive Disoder (ie MCI). Etiology of deficits unclear, Alzheimer's versus vascular, some parts of testing were inconsistent with either presentation.   Since then, [name] sent a message through [facility] detailing worsening symptoms. She had visited [name] from November to January, and while there had shingles and an episode  of severe vomiting and diarrhea. Since back home with [name], she has had 2 more episodes of GI issues, one more severe because she was unresponsive and EMS was called. She had seen her PCP and GI, both felt dehydration was the culprit. She had a CT abdomen which reported a large hiatal hernia with the majority of stomach herniated into the thoracic cavity, moderate severe narrowing of the origin of the SMA. There was above average stool burden and was instructed to take Metamucil. Her memory has worsened, short-term memory is very poor. She asks every morning why she needs the Metamucil. She often does not recall what she had for lunch, she would say she ate but her son-in-law says she had not. She rarely remembers what she got in the mail, and asks several times a day if it has come, even if she opened the mail earlier. She asks the same questions and says the same stories during phone conversations. She has increasing difficulty operating her phone or camera. She is not sure when Kerri Simmons asks if she took her medications, Kerri Simmons checks behind her. They are having difficulties getting her to keep hydrated. She does not see anything wrong with her memory. She does not recall a fall in December, she was trying to get in bed and pulled on a pillow and fell. She is able to bathe and dress independently, but one time could not get out of the tub, she does not recall this.    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 Her daughter Kerri Simmons was present during the visit and Kerri Simmons  was on the phone to provide additional information. She states she did not really know she was having a problem with her memory and was surprised when her daughters raised concern. She had been living alone, very independent until she had a sudden change in cognitive status between March  and June. She had been staying at [place] from December 2019 to March 2020 helping her after her surgery, and had been sorting out Jackie's medications and giving them to her. They started noticing changes from March to June which they attributed to being isolated with the pandemic. Kerri Simmons noticed that her medications were messed up, her pillpacks were half taken. She was repeating herself and forgetting things that she would usually know, for instance she is a retired Marine scientist and her granddaughter is a Marine scientist as well, but she asked what kind of work her granddaughter does or where a certain child is working. All of her bills are on autopay. She stopped driving several years ago. Family reports that the changes are "not terrible but something is going on." She is independent with dressing and bathing. Sleep is good, no wandering behavior. No paranoia or hallucinations. Mood is okay but Kerri Simmons notices that when she is home alone "she loses her sparkle."   She has occasional headaches with pain on the left temporal region. She has occasional dizziness. No diplopia, dysarthria/dysphagia, neck pain, focal numbness/tingling/weakness, bowel/bladder dysfunction, anosmia, or tremors. She has noticed numbness in her fingertips that she cannot hold on to things. She uses a cane in open spaces because her balance is off. She denies any falls but states she hit her head on the door, again pointing to the left temporal region. Her father and several siblings had vascular dementia in their mid-34s. She denies any alcohol use.    Current Outpatient Medications on File Prior to Visit  Medication Sig Dispense Refill  . azelastine (ASTELIN) 0.1 % nasal spray Place 2 sprays into both nostrils 2 (two) times daily. 30 mL 12  . Carboxymethylcellulose Sodium (EYE DROPS OP) Apply to eye.    . cycloSPORINE (RESTASIS OP) Apply to eye.    . dicyclomine (BENTYL) 10 MG capsule Take 10 mg by mouth 4 (four) times daily -  before  meals and at bedtime.    Marland Kitchen esomeprazole (NEXIUM) 40 MG capsule Take 1 capsule (40 mg total) by mouth 2 (two) times daily before a meal. 180 capsule 3  . ferrous sulfate 325 (65 FE) MG tablet Take 325 mg by mouth daily with breakfast.    . levothyroxine (SYNTHROID) 50 MCG tablet Take 1 tablet (50 mcg total) by mouth daily before breakfast. 90 tablet 2  . loratadine (CLARITIN) 10 MG tablet Take 10 mg by mouth daily.    . mirabegron ER (MYRBETRIQ) 25 MG TB24 tablet Take 1 tablet (25 mg total) by mouth daily. 90 tablet 2  . Multiple Minerals-Vitamins (CALCIUM CITRATE-MAG-MINERALS PO) Take by mouth.    . Multiple Vitamins-Minerals (PRESERVISION AREDS 2 PO) Take by mouth 2 (two) times a day.    . ondansetron (ZOFRAN ODT) 8 MG disintegrating tablet Take 1 tablet (8 mg total) by mouth every 8 (eight) hours as needed for nausea or vomiting. 20 tablet 0  . ramipril (ALTACE) 10 MG capsule Take 1 capsule (10 mg total) by mouth daily. 180 capsule 2   No current facility-administered medications on file prior to visit.       Observations/Objective:  Vitals:   12/27/19 1511  Weight: 112 lb (50.8  kg)  Height: _0  (1.575 m)   Exam limited due to nature of phone visit. Patient is awake, alert, oriented to month/year. Says that it is "Monday, March 6, 7,8, that week." 5/5 WORLD backward. 0/3 delayed recall.   Assessment and Plan:   This is a pleasant 86 yo RH woman with a history of hypertension, hypothyroidism, with worsening memory. Neuropsychological testing in October 2020 indicated Mild Neurocognitive Disorder, vascular versus Alzheimer's. Head CT showed diffuse atrophy and chronic microvascular disease. She had been quite independent, managing complex tasks, family reports worsening since Neuropsych testing. We discussed medications used for dementia, including side effects and expectations, particularly in her age group. Family would like to do a trial of rivastigmine 1.2m BID, monitor for GI issues.  Continue 24/7 care. Follow-up in 6 months, they know to call for any changes.   Follow Up Instructions:   -I discussed the assessment and treatment plan with the patient/family. The patient/family were provided an opportunity to ask questions and all were answered. The patient/family agreed with the plan and demonstrated an understanding of the instructions.   The patient/family were advised to call back or seek an in-person evaluation if the symptoms worsen or if the condition fails to improve as anticipated.    Total Time spent in visit with the patient was:  27:00 minutes, of which 100% of the time was spent in counseling and/or coordinating care on the above.   Pt understands and agrees with the plan of care outlined.     KCameron Sprang MD

## 2020-01-07 ENCOUNTER — Ambulatory Visit (INDEPENDENT_AMBULATORY_CARE_PROVIDER_SITE_OTHER): Payer: Medicare Other | Admitting: Gastroenterology

## 2020-01-07 ENCOUNTER — Encounter: Payer: Self-pay | Admitting: Gastroenterology

## 2020-01-07 VITALS — BP 110/60 | HR 61 | Temp 97.8°F | Ht 61.75 in | Wt 114.0 lb

## 2020-01-07 DIAGNOSIS — R112 Nausea with vomiting, unspecified: Secondary | ICD-10-CM | POA: Diagnosis not present

## 2020-01-07 DIAGNOSIS — K589 Irritable bowel syndrome without diarrhea: Secondary | ICD-10-CM | POA: Diagnosis not present

## 2020-01-07 DIAGNOSIS — K449 Diaphragmatic hernia without obstruction or gangrene: Secondary | ICD-10-CM | POA: Diagnosis not present

## 2020-01-07 DIAGNOSIS — K5730 Diverticulosis of large intestine without perforation or abscess without bleeding: Secondary | ICD-10-CM

## 2020-01-07 NOTE — Patient Instructions (Signed)
Dr Tarri Glenn recommends that you complete a bowel purge (to clean out your bowels). Please do the following: Purchase a bottle of Miralax over the counter as well as a box of 5 mg dulcolax tablets. Take 4 dulcolax tablets. Wait 1 hour. You will then drink 6-8 capfuls of Miralax mixed in an adequate amount of water/juice/gatorade (you may choose which of these liquids to drink) over the next 2-3 hours. You should expect results within 1 to 6 hours after completing the bowel purge.   Then resume daily Metamucil after the bowel purge.   We have sent the following medications to your pharmacy for you to pick up at your convenience:  Bentyl 10 mg

## 2020-01-07 NOTE — Progress Notes (Signed)
Referring Provider: Vivi Barrack, MD Primary Care Physician:  Vivi Barrack, MD  Chief complaint:  Kerri GI symptoms, discuss CT results   IMPRESSION:  Daily abdominal pain Acute, recurrent onset of nausea, vomiting, acute diarrhea with syncope    - normal liver enzymes, amylase, lipase; normal CBC    - normal HIDA with CCK 05/17/10    - EGD and colonoscopy 11/27/2007 Large stool burden on CT 12/10/19 Large hiatal hernia on EGD 2009 and CT 2021 Diverticulosis Moderate to severe narrowing of the origin of the SMA    -Patent celiac axis and IMA Diagnosis of IBS made in [place] 5 years ago    -managed with Bentyl Thrombocytopenia Hyperglycemia    - serum glucose 136 11/16/19    - serum glucose 105 12/02/19  Abdominal pain: Unclear if daily symptoms are related to severe acute symptoms. May be due to symptomatic constipation or large hiatal hernia seen on CT. Contrasted CT scan was overall reassuring. SMA is unlikely to be the cause given the patient celiac axis and IMA.  Will plan bowel purge. Resume Metamucil. Continue PPI BID. Consider adding H2Blocker with any symptoms that may be related to her large hiatal hernia.  Both the patient and her daughter do not want to consider endoscopy.   PLAN: Bowel purge Then resume daily Metamucil daily Refill Bentyl 10 QID (#50 with 2 refills) Follow-up in 6-8 weeks, earlier as needed  I spent over 30 minutes, including in depth chart review, independent review of results as outlined above, communicating results with the patient directly, face-to-face time with the patient, coordinating care, and ordering studies and medications as appropriate, and documentation.  HPI: Kerri Simmons is a 86 y.o. female under evaluation for nausea, vomiting. Her daughter, with whom she lives, accompanies her to this appointment and provides most of the history.  Kerri Simmons, Kerri Simmons, arthritis, thyroid disease,  hypercholesterolemia and cognitive impairment. She is a retired Marine scientist.  Has had discrete episode of severe nausea, vomiting 10/01/2019, 11/14/2019 and 11/23/2019. Starts with non-bloody vomiting. Followed by diarrhea. Ultimately has a syncopal event. No associated chest pain, shortness of breath, lightheadedness or dizziness. Has tried Imodium and SL Nitroglycerin without significant symptom improvement.  Blood pressure has also been very labile. Rampiril recently reduced to one daily.  Head CT without contrast 05/25/19: no acute changes; chronic microvascular angiopathy changes and parenchymal volume loss  Labs 11/16/19: Normal TSH, Normal CMP except for glucose of 136, normal CBC except for platelets of 106  CT of the abdomen and pelvis with contrast 12/10/2019: Normal liver, normal pancreas, normal spleen, large hiatal hernia with the majority of the stomach herniated into the thoracic cavity, large stool burden, diverticulosis, moderate to severe narrowing of the origin of the SMA with patent celiac and IMA  Labs 12/02/19: normal CMP except glucose 105, BUN 24; liver enzymes, amylase and lipase normal  Today I reviewed records from GI team in [place], [place]. History of GERD treated with esomeprazole 40 mg daily and IBS with excessive gas and borboygmi treated with Bentyl as needed. She lost 5-6 pounds in 2019. Abdominal ultrasound for pain and nausea 05/17/10: normal except for a small right kidney. HIDA with CCK 05/17/10: normal HIDA with GBEF 93.6%.  EGD and Colonoscopy 11/27/2007: large hiatal hernia; melanosis coli  Returns today in scheduled follow-up to review these results. Her daughter notes that she has complained about diffuse, non-radiating abdominal pain every day since her consultation here. She reports a bowel  movement every 2-3 days. Sense of incomplete evacuation. Denies diarrhea.    She has had no further severe episodes nausea, vomiting, acute diarrhea with syncope.   Daughter notes that  she had one night where she had 3 bowel movements. Using Metamucil daily although the patient is puzzled about why she needs the Metamucil. Some improvement in abdominal pain after defecation.  No known blood or mucous. Frequency of stools is largely unknown. Daughter notes there are two Bentyl pills left in the bottle. Not sure when her Mom is using them or if they have been helpful.  No new complaints or concerns.   She is now taking rivastigmine (EXELON) to try to minimize any GI side effects of Aricept.   Past Medical History:  Diagnosis Date  . Allergy   . Arthritis   . Cancer ([place])   . Essential Simmons 04/19/2019  . GERD (gastroesophageal reflux disease) 04/19/2019  . Glaucoma 04/19/2019  . Hyperlipidemia   . Hypothyroidism 04/19/2019  . IBS (irritable bowel syndrome) 04/19/2019  . Macular degeneration, left eye 10/21/2008  . Migraines   . Orthostasis 04/19/2019  . Pneumonia   . Thyroid disease     Past Surgical History:  Procedure Laterality Date  . ABDOMINAL HYSTERECTOMY    . FETAL BLOOD TRANSFUSION    . SQUAMOUS CELL CARCINOMA EXCISION Left    upper left arm  . THYROID LOBECTOMY      Current Outpatient Medications  Medication Sig Dispense Refill  . azelastine (ASTELIN) 0.1 % nasal spray Place 2 sprays into both nostrils 2 (two) times daily. 30 mL 12  . Carboxymethylcellulose Sodium (EYE DROPS OP) Apply to eye.    . cycloSPORINE (RESTASIS OP) Apply to eye.    . dicyclomine (BENTYL) 10 MG capsule Take 10 mg by mouth 4 (four) times daily -  before meals and at bedtime.    Marland Kitchen esomeprazole (NEXIUM) 40 MG capsule Take 1 capsule (40 mg total) by mouth 2 (two) times daily before a meal. 180 capsule 3  . ferrous sulfate 325 (65 FE) MG tablet Take 325 mg by mouth daily with breakfast.    . levothyroxine (SYNTHROID) 50 MCG tablet Take 1 tablet (50 mcg total) by mouth daily before breakfast. 90 tablet 2  . loratadine (CLARITIN) 10 MG tablet Take 10 mg by mouth daily.    .  mirabegron ER (MYRBETRIQ) 25 MG TB24 tablet Take 1 tablet (25 mg total) by mouth daily. 90 tablet 2  . Multiple Minerals-Vitamins (CALCIUM CITRATE-MAG-MINERALS PO) Take by mouth.    . Multiple Vitamins-Minerals (PRESERVISION AREDS 2 PO) Take by mouth 2 (two) times a day.    . ondansetron (ZOFRAN ODT) 8 MG disintegrating tablet Take 1 tablet (8 mg total) by mouth every 8 (eight) hours as needed for nausea or vomiting. 20 tablet 0  . ramipril (ALTACE) 10 MG capsule Take 1 capsule (10 mg total) by mouth daily. 180 capsule 2  . rivastigmine (EXELON) 1.5 MG capsule Take 1 capsule (1.5 mg total) by mouth 2 (two) times daily. 60 capsule 1   No current facility-administered medications for this visit.    Allergies as of 01/07/2020 - Review Complete 12/27/2019  Allergen Reaction Noted  . Sulfa antibiotics  04/19/2019    Family History  Problem Relation Age of Onset  . Dementia Father   . Congenital heart disease Father   . Dementia Brother        Vascular  . Heart disease Brother   . Diabetes Mother   .  Heart disease Mother   . Diabetes Sister   . Heart disease Sister   . Diabetes Sister   . Colon cancer Neg Hx   . Esophageal cancer Neg Hx   . Stomach cancer Neg Hx   . Pancreatic cancer Neg Hx     Social History   Socioeconomic History  . Marital status: Widowed    Spouse name: Not on file  . Number of children: Not on file  . Years of education: 17  . Highest education level: Not on file  Occupational History  . Not on file  Tobacco Use  . Smoking status: Never Smoker  . Smokeless tobacco: Never Used  Substance and Sexual Activity  . Alcohol use: Never  . Drug use: Never  . Sexual activity: Not Currently  Other Topics Concern  . Not on file  Social History Narrative   Lives with daughter at this time      Right handed      Retired Therapist, sports   Social Determinants of Radio broadcast assistant Strain:   . Difficulty of Paying Living Expenses:   Food Insecurity:   .  Worried About Charity fundraiser in the Last Year:   . Arboriculturist in the Last Year:   Transportation Needs:   . Film/video editor (Medical):   Marland Kitchen Lack of Transportation (Non-Medical):   Physical Activity:   . Days of Exercise per Week:   . Minutes of Exercise per Session:   Stress:   . Feeling of Stress :   Social Connections:   . Frequency of Communication with Friends and Family:   . Frequency of Social Gatherings with Friends and Family:   . Attends Religious Services:   . Active Member of Clubs or Organizations:   . Attends Archivist Meetings:   Marland Kitchen Marital Status:   Intimate Partner Violence:   . Fear of Current or Ex-Partner:   . Emotionally Abused:   Marland Kitchen Physically Abused:   . Sexually Abused:      Physical Exam: General:   Alert,  well-nourished, pleasant and cooperative in NAD Head:  Normocephalic and atraumatic. Eyes:  Sclera clear, no icterus.   Conjunctiva pink. Ears:  Normal auditory acuity. Nose:  No deformity, discharge,  or lesions. Abdomen:  Soft, nontender, nondistended, normal bowel sounds, no rebound or guarding. No hepatosplenomegaly.   Rectal:  Deferred  Msk:  Symmetrical. No boney deformities LAD: No inguinal or umbilical LAD Extremities:  No clubbing or edema. Neurologic:  Alert. Grossly nonfocal. Skin:  No rash or bruise.  Psych:  Alert and cooperative. Normal mood and affect.     Betzaira Mentel L. Tarri Glenn, MD, MPH 01/07/2020, 1:51 PM

## 2020-01-11 MED ORDER — DICYCLOMINE HCL 10 MG PO CAPS: 10.0000 mg | ORAL_CAPSULE | Freq: Three times a day (TID) | ORAL | 3 refills | Status: AC

## 2020-01-24 ENCOUNTER — Telehealth: Payer: Self-pay | Admitting: Neurology

## 2020-01-24 ENCOUNTER — Telehealth: Payer: Self-pay

## 2020-01-24 MED ORDER — RIVASTIGMINE TARTRATE 3 MG PO CAPS: 3.0000 mg | ORAL_CAPSULE | Freq: Two times a day (BID) | ORAL | 0 refills | Status: DC

## 2020-01-24 NOTE — Telephone Encounter (Signed)
Spoke to pt daughter, script sent to [facility] in [facility]

## 2020-01-24 NOTE — Telephone Encounter (Signed)
Patient's daughter called and left a message requesting a refill on rivasitgmine 1.5 MG. The patient just started on it but will need refills at this point. She'd like to know if the dosage needs increased at this point as well.  The patient is staying with her other daughter in [place] through the end of April. She said the patient is still having issues.  [facility] [id number] in [place], [place]

## 2020-01-24 NOTE — Telephone Encounter (Signed)
If she is not having any side effects, pls let her know we will increase rivastigmine to 3mg  BID, pls send refills, thanks

## 2020-01-24 NOTE — Telephone Encounter (Signed)
If she is not having any side effects, e will increase rivastigmine to 3mg  BID, script sent to [facility] [phone] in [place], [place]

## 2020-03-07 ENCOUNTER — Ambulatory Visit (INDEPENDENT_AMBULATORY_CARE_PROVIDER_SITE_OTHER): Payer: Medicare Other | Admitting: Gastroenterology

## 2020-03-07 ENCOUNTER — Ambulatory Visit: Payer: Medicare Other | Admitting: Gastroenterology

## 2020-03-07 ENCOUNTER — Encounter: Payer: Self-pay | Admitting: Gastroenterology

## 2020-03-07 VITALS — BP 102/74 | HR 65 | Ht 61.5 in | Wt 112.0 lb

## 2020-03-07 DIAGNOSIS — R109 Unspecified abdominal pain: Secondary | ICD-10-CM | POA: Diagnosis not present

## 2020-03-07 DIAGNOSIS — R112 Nausea with vomiting, unspecified: Secondary | ICD-10-CM

## 2020-03-07 DIAGNOSIS — K449 Diaphragmatic hernia without obstruction or gangrene: Secondary | ICD-10-CM

## 2020-03-07 NOTE — Progress Notes (Signed)
Referring Provider: Vivi Barrack, MD Primary Care Physician:  Vivi Barrack, MD  Chief complaint:  Intermittent GI symptoms, discuss CT results   IMPRESSION:  Daily abdominal pain Acute, recurrent onset of nausea, vomiting, acute diarrhea with syncope    - normal liver enzymes, amylase, lipase; normal CBC    - normal HIDA with CCK 05/17/10    - EGD and colonoscopy 11/27/2007 Large stool burden on CT 12/10/19 Large hiatal hernia on EGD 2009 and CT 2021 Diverticulosis Moderate to severe narrowing of the origin of the SMA    -Patent celiac axis and IMA Diagnosis of IBS made in [place] 5 years ago    -managed with Bentyl Thrombocytopenia Hyperglycemia    - serum glucose 136 11/16/19    - serum glucose 105 12/02/19  Abdominal pain: Unclear if daily symptoms are related to severe acute symptoms. No significant change in symptoms with bowel purge to treat possible constipation given the stool burden seen on CT. Her large hiatal hernia and her history of GERD may be contributing. All of her symptoms may be side effects of rivastigmine. Oral iron supplements may also cause multiple GI symptoms.  Weight is overall stable. We discussed the options of additional additional standing treatment - either Bentyl and/or famotidine - to prevent the acute monthly episodes. The daughter is having difficulty getting her mother to take her medications as it is. I've encouraged them to discuss strategies with Dr. Jerline Pain. The daughter also wonders if her symptoms are related to her back brace, as this was replaced just before the symptoms started. Both the patient and her daughter remain resistant to any endoscopy.   PLAN: - Continue daily Metamucil daily - Continue Nexium ro mg BID - Continue Bentyl 10 QID (#50 with 2 refills) - consider a standing dose prior to all meals - Consider addition of famotidine 20 mg BID given ongoing symptoms and history of GERD - ? Related to brace - Will ask Dr. Jerline Pain to  review non-GI etiologies of symptoms - Avoid medications associated with GI symptoms - can iron be discontinued? Alternatives to rivastigmine?  I spent 40  minutes, including in depth chart review, face-to-face time with the patient and her daughter, coordinating care, ordering medications as appropriate, and documentation.   HPI: Kerri Simmons is a 86 y.o. female under evaluation for nausea, vomiting. Her daughter, with whom she lives, accompanies her to this appointment and provides most of the interval history.  [name] has hypertension, intermittent anemia, arthritis, thyroid disease, hypercholesterolemia and mild memory loss. Previously under the care of a gastroenterologist in [place], [place] for GERD treated with esomeprazole 40 mg daily, IBS with excessive gas and borboygmi treated with Bentyl, and a large hiatal hernia.  She is a retired Marine scientist.  She has been having discrete episodes of severe nausea, vomiting occurring monthly since 09/2019. Starts with non-bloody vomiting. Followed by diarrhea. Ultimately has a syncopal event. No associated chest pain, shortness of breath, lightheadedness or dizziness. Has tried Imodium, SL Nitroglycerin, near daily Metamucil and Bentyl without significant symptom improvement. Tried a bowel purge after the patient reported a bowel movement every 2-3 days with a sense of incomplete evaluation without significant change in her symptoms.  Blood pressure has also been very labile.  There have been no identified triggers.   Head CT without contrast 05/25/19: no acute changes; chronic microvascular angiopathy changes and parenchymal volume loss  Labs 11/16/19: Normal TSH, Normal CMP except for glucose of 136, normal CBC except  for platelets of 106 Labs 12/02/19: normal CMP except glucose 105, BUN 24; liver enzymes, amylase and lipase normal  CT of the abdomen and pelvis with contrast 12/10/2019: Normal liver, normal pancreas, normal spleen, large hiatal hernia  with the majority of the stomach herniated into the thoracic cavity, large stool burden, diverticulosis, moderate to severe narrowing of the origin of the SMA with patent celiac and IMA  She is now taking rivastigmine (EXELON) to try to minimize any GI side effects of Aricept.   Continues to have monthly episodes of acute symptoms. Also reporting daily abdominal pain/discomfort.  She goes from feeling well to acutely not feeling well.   Has dicyclomine but she takes it after her symptoms start and it hasn't been very helpful.   No identified symptom triggers.   Past Medical History:  Diagnosis Date  . Allergy   . Arthritis   . Cancer ([place])   . Essential hypertension 04/19/2019  . GERD (gastroesophageal reflux disease) 04/19/2019  . Glaucoma 04/19/2019  . Hyperlipidemia   . Hypothyroidism 04/19/2019  . IBS (irritable bowel syndrome) 04/19/2019  . Macular degeneration, left eye 10/21/2008  . Migraines   . Orthostasis 04/19/2019  . Pneumonia   . Thyroid disease     Past Surgical History:  Procedure Laterality Date  . ABDOMINAL HYSTERECTOMY    . FETAL BLOOD TRANSFUSION    . SQUAMOUS CELL CARCINOMA EXCISION Left    upper left arm  . THYROID LOBECTOMY      Current Outpatient Medications  Medication Sig Dispense Refill  . azelastine (ASTELIN) 0.1 % nasal spray Place 2 sprays into both nostrils 2 (two) times daily. 30 mL 12  . Carboxymethylcellulose Sodium (EYE DROPS OP) Apply to eye.    . cycloSPORINE (RESTASIS OP) Apply to eye.    . dicyclomine (BENTYL) 10 MG capsule Take 1 capsule (10 mg total) by mouth 4 (four) times daily -  before meals and at bedtime. Takes as needed 120 capsule 3  . esomeprazole (NEXIUM) 40 MG capsule Take 1 capsule (40 mg total) by mouth 2 (two) times daily before a meal. 180 capsule 3  . ferrous sulfate 325 (65 FE) MG tablet Take 325 mg by mouth daily with breakfast.    . levothyroxine (SYNTHROID) 50 MCG tablet Take 1 tablet (50 mcg total) by mouth daily  before breakfast. 90 tablet 2  . loratadine (CLARITIN) 10 MG tablet Take 10 mg by mouth daily.    . Multiple Minerals-Vitamins (CALCIUM CITRATE-MAG-MINERALS PO) Take by mouth.    . Multiple Vitamins-Minerals (PRESERVISION AREDS 2 PO) Take by mouth 2 (two) times a day.    . ondansetron (ZOFRAN ODT) 8 MG disintegrating tablet Take 1 tablet (8 mg total) by mouth every 8 (eight) hours as needed for nausea or vomiting. 20 tablet 0  . rivastigmine (EXELON) 3 MG capsule Take 1 capsule (3 mg total) by mouth 2 (two) times daily. 180 capsule 0  . mirabegron ER (MYRBETRIQ) 25 MG TB24 tablet Take 1 tablet (25 mg total) by mouth daily. 90 tablet 2  . ramipril (ALTACE) 10 MG capsule Take 1 capsule (10 mg total) by mouth daily. 180 capsule 2   No current facility-administered medications for this visit.    Allergies as of 03/07/2020 - Review Complete 03/07/2020  Allergen Reaction Noted  . Sulfa antibiotics  04/19/2019    Family History  Problem Relation Age of Onset  . Dementia Father   . Congenital heart disease Father   . Dementia Brother  Vascular  . Heart disease Brother   . Diabetes Mother   . Heart disease Mother   . Diabetes Sister   . Heart disease Sister   . Diabetes Sister   . Colon cancer Neg Hx   . Esophageal cancer Neg Hx   . Stomach cancer Neg Hx   . Pancreatic cancer Neg Hx     Social History   Socioeconomic History  . Marital status: Widowed    Spouse name: Not on file  . Number of children: 2  . Years of education: 27  . Highest education level: Not on file  Occupational History  . Not on file  Tobacco Use  . Smoking status: Never Smoker  . Smokeless tobacco: Never Used  Substance and Sexual Activity  . Alcohol use: Never  . Drug use: Never  . Sexual activity: Not Currently  Other Topics Concern  . Not on file  Social History Narrative   Lives with daughter at this time      Right handed      Retired Therapist, sports   Social Determinants of Systems developer Strain:   . Difficulty of Paying Living Expenses:   Food Insecurity:   . Worried About Charity fundraiser in the Last Year:   . Arboriculturist in the Last Year:   Transportation Needs:   . Film/video editor (Medical):   Marland Kitchen Lack of Transportation (Non-Medical):   Physical Activity:   . Days of Exercise per Week:   . Minutes of Exercise per Session:   Stress:   . Feeling of Stress :   Social Connections:   . Frequency of Communication with Friends and Family:   . Frequency of Social Gatherings with Friends and Family:   . Attends Religious Services:   . Active Member of Clubs or Organizations:   . Attends Archivist Meetings:   Marland Kitchen Marital Status:   Intimate Partner Violence:   . Fear of Current or Ex-Partner:   . Emotionally Abused:   Marland Kitchen Physically Abused:   . Sexually Abused:      Physical Exam: General:   Alert,  well-nourished, pleasant and cooperative in NAD Weight 12/02/19: 112 pounds 2 ounces Weight 01/07/20: 114 pounds Weight today 112 pounds Head:  Normocephalic and atraumatic. Eyes:  Sclera clear, no icterus.   Conjunctiva pink. Ears:  Normal auditory acuity. Nose:  No deformity, discharge,  or lesions. Abdomen:  Removed her back brace for her exam today, Soft, nontender, nondistended, normal bowel sounds, no rebound or guarding. No hepatosplenomegaly.   Rectal:  Deferred  Msk:  Symmetrical. No boney deformities LAD: No inguinal or umbilical LAD Extremities:  No clubbing or edema. Neurologic:  Alert. Grossly nonfocal. Skin:  No rash or bruise.  Psych:  Alert and cooperative. Normal mood and affect.     Lean Jaeger L. Tarri Glenn, MD, MPH 03/07/2020, 3:57 PM

## 2020-03-07 NOTE — Patient Instructions (Addendum)
Please take your Bentyl prior to lunch and dinner.  Continue to take your Metamucil every day.   I will review your symptoms with Dr. Jerline Pain before we add any additional medications.  If you are age 83 or older, your body mass index should be between 23-30. Your Body mass index is 20.82 kg/m. If this is out of the aforementioned range listed, please consider follow up with your Primary Care Provider.  Due to recent changes in healthcare laws, you may see the results of your imaging and laboratory studies on MyChart before your provider has had a chance to review them.  We understand that in some cases there may be results that are confusing or concerning to you. Not all laboratory results come back in the same time frame and the provider may be waiting for multiple results in order to interpret others.  Please give [place] 48 hours in order for your provider to thoroughly review all the results before contacting the office for clarification of your results.

## 2020-03-08 ENCOUNTER — Encounter: Payer: Self-pay | Admitting: Family Medicine

## 2020-03-14 ENCOUNTER — Encounter: Payer: Self-pay | Admitting: Family Medicine

## 2020-03-14 ENCOUNTER — Other Ambulatory Visit: Payer: Self-pay

## 2020-03-14 ENCOUNTER — Ambulatory Visit (INDEPENDENT_AMBULATORY_CARE_PROVIDER_SITE_OTHER): Payer: Medicare Other | Admitting: Family Medicine

## 2020-03-14 VITALS — BP 122/74 | HR 63 | Temp 98.1°F | Ht 61.5 in | Wt 112.6 lb

## 2020-03-14 DIAGNOSIS — M25511 Pain in right shoulder: Secondary | ICD-10-CM | POA: Diagnosis not present

## 2020-03-14 DIAGNOSIS — K5900 Constipation, unspecified: Secondary | ICD-10-CM

## 2020-03-14 DIAGNOSIS — M545 Low back pain, unspecified: Secondary | ICD-10-CM | POA: Insufficient documentation

## 2020-03-14 DIAGNOSIS — M6740 Ganglion, unspecified site: Secondary | ICD-10-CM | POA: Diagnosis not present

## 2020-03-14 DIAGNOSIS — N3281 Overactive bladder: Secondary | ICD-10-CM | POA: Diagnosis not present

## 2020-03-14 MED ORDER — DICLOFENAC SODIUM 50 MG PO TBEC: 50.0000 mg | DELAYED_RELEASE_TABLET | Freq: Two times a day (BID) | ORAL | 0 refills | Status: DC

## 2020-03-14 NOTE — Assessment & Plan Note (Signed)
Has seen sports med in the past.  She has had some worsening recently.  Thinks it may be due to constipation.  Will place referral to physical therapy.  We will also start diclofenac as above which should help.

## 2020-03-14 NOTE — Assessment & Plan Note (Signed)
Had lengthy discussion with patient and daughter regarding treatment for constipation.  I think that her constipation could explain quite a bit of her low back pain in addition to her abdominal pain and nausea.  Discussed importance of good oral hydration and recommended that she get at least 64 ounces of fluids per day.  She will continue fiber supplementation with Metamucil.  Also recommended daily MiraLAX need to have a least 1 soft bowel movement daily.

## 2020-03-14 NOTE — Progress Notes (Signed)
Kerri Simmons is a 86 y.o. female who presents today for an office visit.  Assessment/Plan:  New/Acute Problems: Right shoulder pain Likely rotator cuff tendinopathy.  Will start diclofenac 50 mg twice daily for 1 week, then to use as needed afterwards.  We will also place referral to physical therapy.  Ganglion cyst Aspiration performed today.  She tolerated well.  See below procedure note.  Chronic Problems Addressed Today: Low back pain Has seen sports med in the past.  She has had some worsening recently.  Thinks it may be due to constipation.  Will place referral to physical therapy.  We will also start diclofenac as above which should help.  Constipation Had lengthy discussion with patient and daughter regarding treatment for constipation.  I think that her constipation could explain quite a bit of her low back pain in addition to her abdominal pain and nausea.  Discussed importance of good oral hydration and recommended that she get at least 64 ounces of fluids per day.  She will continue fiber supplementation with Metamucil.  Also recommended daily MiraLAX need to have a least 1 soft bowel movement daily.  Overactive bladder We will stop Myrbetriq to see if this helps with constipation.  Also discussed the fact that improving constipation will help with her overactive bladder.     Subjective:  HPI:  Patient here today with her daughter.  She has multiple complaints  She is still having ongoing issues with nausea and vomiting.  She has seen GI.  Diagnosed with hiatal hernia.  She was also found to be severely constipated.  She has only having bowel movement once every 2 to 3 days.  She feels like she is getting plenty of fluids.  Daughter states that she does not think that she is getting enough fluid.  She has been taking Metamucil with no significant improvement.  She has not tried any laxatives.  She has also had right shoulder pain for the past several weeks to months.   She had tried taking Tylenol which helps modestly.  No obvious injuries or precipitating events.  Worse with certain motions.  She also has a cyst on left thumb.  This is been present for several years.  Said similar cyst drained in the past on other digits.        Objective:  Physical Exam: BP 122/74 (BP Location: Left Arm, Patient Position: Sitting, Cuff Size: Normal)   Pulse 63   Temp 98.1 F (36.7 C) (Temporal)   Ht 5' 1.5" (1.562 m)   Wt 112 lb 9.6 oz (51.1 kg)   SpO2 100%   BMI 20.93 kg/m   Gen: No acute distress, resting comfortably CV: Regular rate and rhythm with no murmurs appreciated Pulm: Normal work of breathing, clear to auscultation bilaterally with no crackles, wheezes, or rhonchi MSK: -Left hand: Approximately 1 cm ganglion cyst on left first interphalangeal joint. -Left shoulder: No deformities.  Pain elicited with passive range of motion.  Very weak and painful with resisted supraspinatus testing. Neuro: Grossly normal, moves all extremities Psych: Normal affect and thought content  Ganglion Cyst Aspiration Verbal consent was obtained. The site was prepped with alcohol solution. Topical ethyl chloride was applied. Approximately 1ccs of 1% lidocaine without epinephrine was infiltrated.  18-gauge needle was then inserted into the ganglion cyst. Approximately 1 ccs of material was aspirated. Needle was then removed. Bandage was applied. Patient tolerated procedure well. Minimal blood loss. No complications.    Time Spent: 45 minutes of  total time was spent on the date of the encounter performing the following actions: chart review prior to seeing the patient, obtaining history, performing a medically necessary exam, counseling on the treatment plan, placing orders, and documenting in our EHR. This time does not include time spent performing above procedure.        Algis Greenhouse. Jerline Pain, MD 03/14/2020 3:23 PM

## 2020-03-14 NOTE — Assessment & Plan Note (Signed)
We will stop Myrbetriq to see if this helps with constipation.  Also discussed the fact that improving constipation will help with her overactive bladder.

## 2020-03-14 NOTE — Patient Instructions (Addendum)
It was very nice to see you today!  We drained your cyst today.  Please let us know if it comes back.  Sure that you are getting plenty of fluids.  I would like you to get only 64 ounces of water daily.  You can try using Benefiber instead of Metamucil.  You can also use MiraLAX as needed daily to have a least 1 soft bowel movement.  I will place a referral for you to see the physical therapist for your back pain and shoulder.  Please use the diclofenac as needed.  Take care, Dr Jerline Pain  Please try these tips to maintain a healthy lifestyle:   Eat at least 3 REAL meals and 1-2 snacks per day.  Aim for no more than 5 hours between eating.  If you eat breakfast, please do so within one hour of getting up.    Each meal should contain half fruits/vegetables, one quarter protein, and one quarter carbs (no bigger than a computer mouse)   Cut down on sweet beverages. This includes juice, soda, and sweet tea.     Drink at least 1 glass of water with each meal and aim for at least 8 glasses per day   Exercise at least 150 minutes every week.

## 2020-03-18 ENCOUNTER — Encounter: Payer: Self-pay | Admitting: Family Medicine

## 2020-03-23 ENCOUNTER — Encounter: Payer: Self-pay | Admitting: Family Medicine

## 2020-03-23 ENCOUNTER — Other Ambulatory Visit: Payer: Self-pay

## 2020-03-23 ENCOUNTER — Ambulatory Visit (INDEPENDENT_AMBULATORY_CARE_PROVIDER_SITE_OTHER): Payer: Medicare Other | Admitting: Family Medicine

## 2020-03-23 VITALS — BP 124/64 | HR 57 | Temp 97.7°F | Resp 18 | Ht 61.5 in | Wt 117.2 lb

## 2020-03-23 DIAGNOSIS — M6740 Ganglion, unspecified site: Secondary | ICD-10-CM | POA: Diagnosis not present

## 2020-03-23 DIAGNOSIS — M25511 Pain in right shoulder: Secondary | ICD-10-CM

## 2020-03-23 MED ORDER — DICLOFENAC SODIUM 50 MG PO TBEC: 50.0000 mg | DELAYED_RELEASE_TABLET | Freq: Two times a day (BID) | ORAL | 5 refills | Status: AC

## 2020-03-23 NOTE — Patient Instructions (Signed)
It was very nice to see you today!  We drained your ganglion cyst again today.  You had some bleeding into the area which caused a recurrence.  Please use the pressure bandage for the next 2 to 3 days.  If it comes back again please let me know and I will refer you over to see a hand surgeon.  I will refill your diclofenac today also.   Take care, Dr Jerline Pain  Please try these tips to maintain a healthy lifestyle:   Eat at least 3 REAL meals and 1-2 snacks per day.  Aim for no more than 5 hours between eating.  If you eat breakfast, please do so within one hour of getting up.    Each meal should contain half fruits/vegetables, one quarter protein, and one quarter carbs (no bigger than a computer mouse)   Cut down on sweet beverages. This includes juice, soda, and sweet tea.     Drink at least 1 glass of water with each meal and aim for at least 8 glasses per day   Exercise at least 150 minutes every week.

## 2020-03-23 NOTE — Progress Notes (Signed)
   Kerri Simmons is a 86 y.o. female who presents today for an office visit.  Assessment/Plan:  New/Acute Problems: Ganglion Cyst Aspiration performed today.  She tolerated well.  See below procedure note.  Pressure bandaged today to prevent recurrence.  She will need referral to hand surgery if recurs again.  Shoulder Pain Improving since last week.  She would like to continue diclofenac for a few more weeks.  We will send in refill today.    Subjective:  HPI:  Patient here for follow-up.  Last seen about a week ago.  We drained her ganglion cyst on her right thumb.  She did well for couple days however thinks that the cyst does come back.  No serious pain.  No other treatments tried.  She has been taking diclofenac which has been helping with her shoulder pain.       Objective:  Physical Exam: BP 124/64   Pulse (!) 57   Temp 97.7 F (36.5 C) (Temporal)   Resp 18   Ht 5' 1.5" (1.562 m)   Wt 117 lb 3.2 oz (53.2 kg)   SpO2 98%   BMI 21.79 kg/m   Gen: No acute distress, resting comfortably CV: Regular rate and rhythm with no murmurs appreciated Pulm: Normal work of breathing, clear to auscultation bilaterally with no crackles, wheezes, or rhonchi Neuro: Grossly normal, moves all extremities Psych: Normal affect and thought content  Ganglion Cyst Aspiration Written consent was obtained. The site was prepped with alcohol. Topical ethyl chloride was applied. Needle was then inserted into the ganglion cyst. Approximately 1 ccs of bloody drainage was aspirated. t. Needle was then removed. Bandage was applied. Patient tolerated procedure well. Minimal blood loss. No complications.        Algis Greenhouse. Jerline Pain, MD 03/23/2020 4:02 PM

## 2020-04-11 ENCOUNTER — Other Ambulatory Visit: Payer: Self-pay | Admitting: Family Medicine

## 2020-04-20 ENCOUNTER — Other Ambulatory Visit: Payer: Self-pay

## 2020-04-20 ENCOUNTER — Ambulatory Visit (INDEPENDENT_AMBULATORY_CARE_PROVIDER_SITE_OTHER): Payer: Medicare Other | Admitting: Physical Therapy

## 2020-04-20 ENCOUNTER — Encounter: Payer: Self-pay | Admitting: Physical Therapy

## 2020-04-20 DIAGNOSIS — M545 Low back pain: Secondary | ICD-10-CM | POA: Diagnosis not present

## 2020-04-20 DIAGNOSIS — G8929 Other chronic pain: Secondary | ICD-10-CM | POA: Diagnosis not present

## 2020-04-20 DIAGNOSIS — M25511 Pain in right shoulder: Secondary | ICD-10-CM

## 2020-04-20 NOTE — Patient Instructions (Signed)
Access Code: [email or web address] URL: [email or web address] Date: 04/20/2020 Prepared by: Lyndee Hensen  Exercises Supine Shoulder Flexion Extension AAROM with Dowel - 2 x daily - 1 sets - 10 reps Supine Posterior Pelvic Tilt - 2 x daily - 1 sets - 10 reps Supine Single Knee to Chest Stretch - 2 x daily - 3 reps - 30 hold

## 2020-04-24 ENCOUNTER — Encounter: Payer: Self-pay | Admitting: Physical Therapy

## 2020-04-24 NOTE — Therapy (Signed)
[facility] [address] [place], [place], [address] Phone: [phone]   Fax:  [phone]  Physical Therapy Evaluation  Patient Details  Name: Kerri Simmons MRN: [id number] Date of Birth: January 07, 1926 Referring Provider (PT): caleb Jerline Pain   Encounter Date: 04/20/2020   PT End of Session - 04/24/20 1911    Visit Number 1    Number of Visits 12    Date for PT Re-Evaluation 06/01/20    Authorization Type Medicare    PT Start Time 1615    PT Stop Time 1655    PT Time Calculation (min) 40 min    Activity Tolerance Patient tolerated treatment well    Behavior During Therapy [facility] for tasks assessed/performed           Past Medical History:  Diagnosis Date  . Allergy   . Arthritis   . Cancer ([place])   . Essential hypertension 04/19/2019  . GERD (gastroesophageal reflux disease) 04/19/2019  . Glaucoma 04/19/2019  . Hyperlipidemia   . Hypothyroidism 04/19/2019  . IBS (irritable bowel syndrome) 04/19/2019  . Macular degeneration, left eye 10/21/2008  . Migraines   . Orthostasis 04/19/2019  . Pneumonia   . Thyroid disease     Past Surgical History:  Procedure Laterality Date  . ABDOMINAL HYSTERECTOMY    . FETAL BLOOD TRANSFUSION    . SQUAMOUS CELL CARCINOMA EXCISION Left    upper left arm  . THYROID LOBECTOMY      There were no vitals filed for this visit.    Subjective Assessment - 04/24/20 1939    Subjective Pt report significant R shoulder Pain, R handed. Pt and daighter states decreased balance, she is using cane, too tall? She also states chronic pain in low back. She has been wearing back bace for several months, but continues to have soreness. Pt lives with daughter, does independent ADLS, but daughter helps with shopping, cooking, laundry, etc.    Limitations Sitting;Lifting;Standing;Walking;House hold activities    Patient Stated Goals decreased pain in R shoulder and low back    Currently in Pain? Yes    Pain Score 6      Pain Location Shoulder    Pain Orientation Right    Pain Descriptors / Indicators Aching    Pain Type Acute pain    Pain Onset 1 to 4 weeks ago    Pain Frequency Intermittent    Aggravating Factors  sleeping on R side, reaching lifting    Pain Score 4    Pain Location Back    Pain Orientation Right;Left    Pain Descriptors / Indicators Aching    Pain Type Chronic pain    Pain Onset More than a month ago    Pain Frequency Intermittent    Aggravating Factors  standing, walking, sitting, riding in car              [facility] PT Assessment - 04/24/20 0001      Assessment   Medical Diagnosis Low back pain, R shoulder pain    Referring Provider (PT) caleb Parker    Prior Therapy no      Balance Screen   Has the patient fallen in the past 6 months No      Home Environment   Additional Comments lives with daughter      Prior Function   Level of Independence Independent with basic ADLs;Needs assistance with homemaking      Cognition   Overall Cognitive Status Within Functional Limits for tasks  assessed      Posture/Postural Control   Posture Comments Poor seated posture, increased posterior pelvic tilt.       ROM / Strength   AROM / PROM / Strength AROM;Strength;PROM      AROM   Overall AROM Comments Hips: mild limitation    AROM Assessment Site Shoulder;Lumbar    Right/Left Shoulder Right;Left    Right Shoulder Flexion 100 Degrees    Right Shoulder ABduction 80 Degrees    Left Shoulder Flexion 125 Degrees    Left Shoulder ABduction 125 Degrees    Lumbar Flexion mod limitation    Lumbar Extension mod/significant limitation    Lumbar - Right Side Bend mod limitation    Lumbar - Left Side Bend mod limitation      PROM   PROM Assessment Site Shoulder    Right/Left Shoulder Right    Right Shoulder Flexion 125 Degrees    Right Shoulder ABduction 125 Degrees      Strength   Overall Strength Comments Hips: 4-/5 gross     Strength Assessment Site Shoulder    Right/Left  Shoulder Right;Left    Right Shoulder Flexion 3-/5    Right Shoulder ABduction 3-/5    Right Shoulder Internal Rotation 4-/5    Right Shoulder External Rotation 4-/5      Special Tests   Other special tests Neg SLR                       Objective measurements completed on examination: See above findings.       [place] Adult PT Treatment/Exercise - 04/24/20 0001      Exercises   Exercises Lumbar;Shoulder      Lumbar Exercises: Stretches   Single Knee to Chest Stretch 2 reps;30 seconds;Right;Left    Pelvic Tilt 20 reps      Shoulder Exercises: Supine   Flexion AAROM;15 reps    Flexion Limitations cane                  PT Education - 04/24/20 1908    Education Details PT POC, Exam findings, HEP    Person(s) Educated Patient;Child(ren)    Methods Explanation;Demonstration;Tactile cues;Handout;Verbal cues    Comprehension Verbalized understanding;Returned demonstration;Verbal cues required;Tactile cues required;Need further instruction            PT Short Term Goals - 04/24/20 1916      PT SHORT TERM GOAL #1   Title Pt to be independent with initial HEP    Time 2    Period Weeks    Status New    Target Date 05/04/20      PT SHORT TERM GOAL #2   Title Pt to demo ability to self correct posture, for optimal seated lumbar posture.    Time 2    Period Weeks    Status New    Target Date 05/04/20             PT Long Term Goals - 04/24/20 1917      PT LONG TERM GOAL #1   Title Pt to be independent with final HEP for shoulder and back    Time 6    Period Weeks    Status New    Target Date 06/01/20      PT LONG TERM GOAL #2   Title Pt to demo ability to self correct lumbar posture at least 75% of the time in clinic.    Time 6    Period  Weeks    Status New    Target Date 06/01/20      PT LONG TERM GOAL #3   Title Pt to demo improved AROM of R shoulder to at least 120 deg for elevation, to improve ability for ADLs and IADLs.    Time 6     Period Weeks    Status New    Target Date 06/01/20      PT LONG TERM GOAL #4   Title Pt to report decreased pain in back to 0-2/10 with standing , sitting, and activity    Time 6    Period Weeks    Status New    Target Date 06/01/20      PT LONG TERM GOAL #5   Title Pt to report decreased pain in R shoulder, to 0-2/10 with activity and ADLs.    Time 6    Period Weeks    Status New    Target Date 06/01/20                  Plan - 04/24/20 1923    Clinical Impression Statement Pt presents with primary complaint of increased pain in low back. She is wearing back brace most of the time, but does have poor seated posture, with increased posterior pelvic tilt. She has decreased mobility of lumbar spine, and decreased sterngth in hips, LEs and core. Pt also with significant pain and limitation for R shoulder She has decreased PROM and ability for AROM, as well as decreased strength. She has decreased ability for full functional activities, ADLs, and IADLS due to pain. Pt to benefit from skilled PT to improve deficits and pain.    Personal Factors and Comorbidities Age;Comorbidity 1    Comorbidities R shoulder pain, Back pain, Poor balance, CA,    Examination-Activity Limitations Reach Overhead;Squat;Carry;Dressing;Lift;Locomotion Level    Examination-Participation Restrictions Meal Prep;Cleaning;Community Activity;Shop    Stability/Clinical Decision Making Evolving/Moderate complexity    Clinical Decision Making Moderate    Rehab Potential Good    PT Frequency 2x / week    PT Duration 6 weeks    PT Treatment/Interventions ADLs/Self Care Home Management;Cryotherapy;Electrical Stimulation;Gait training;DME Instruction;Ultrasound;Moist Heat;Iontophoresis 4mg /ml Dexamethasone;Stair training;Traction;Functional mobility training;Therapeutic activities;Therapeutic exercise;Neuromuscular re-education;Balance training;Manual techniques;Patient/family education;Passive range of motion;Dry  needling;Vasopneumatic Device;Taping;Spinal Manipulations;Joint Manipulations    PT Home Exercise Plan [email or web address]    Consulted and Agree with Plan of Care Patient;Family member/caregiver           Patient will benefit from skilled therapeutic intervention in order to improve the following deficits and impairments:  Abnormal gait, Decreased range of motion, Difficulty walking, Impaired UE functional use, Increased muscle spasms, Decreased endurance, Decreased activity tolerance, Pain, Improper body mechanics, Impaired flexibility, Hypomobility, Decreased balance, Decreased knowledge of use of DME, Decreased mobility, Decreased strength, Postural dysfunction  Visit Diagnosis: Chronic bilateral low back pain without sciatica  Acute pain of right shoulder     Problem List Patient Active Problem List   Diagnosis Date Noted  . Constipation 03/14/2020  . Low back pain 03/14/2020  . Mild neurocognitive disorder 08/19/2019  . Orthostasis 04/19/2019  . Essential hypertension 04/19/2019  . Hypothyroidism 04/19/2019  . Overactive bladder 04/19/2019  . IBS (irritable bowel syndrome) 04/19/2019  . GERD (gastroesophageal reflux disease) 04/19/2019  . Dyslipidemia 04/19/2019  . Glaucoma 04/19/2019  . Macular degeneration, left eye 10/21/2008    Lyndee Hensen, PT, DPT 7:43 PM  04/24/20    [facility] [place], [place],  [address] Phone: [phone]   Fax:  [phone]  Name: Rozella Servello MRN: [id number] Date of Birth: 06-20-1926

## 2020-04-25 ENCOUNTER — Other Ambulatory Visit: Payer: Self-pay | Admitting: Neurology

## 2020-04-25 ENCOUNTER — Encounter: Payer: Self-pay | Admitting: Physical Therapy

## 2020-04-25 ENCOUNTER — Ambulatory Visit (INDEPENDENT_AMBULATORY_CARE_PROVIDER_SITE_OTHER): Payer: Medicare Other | Admitting: Dermatology

## 2020-04-25 ENCOUNTER — Ambulatory Visit (INDEPENDENT_AMBULATORY_CARE_PROVIDER_SITE_OTHER): Payer: Medicare Other | Admitting: Physical Therapy

## 2020-04-25 ENCOUNTER — Other Ambulatory Visit: Payer: Self-pay

## 2020-04-25 DIAGNOSIS — C4492 Squamous cell carcinoma of skin, unspecified: Secondary | ICD-10-CM

## 2020-04-25 DIAGNOSIS — C44329 Squamous cell carcinoma of skin of other parts of face: Secondary | ICD-10-CM

## 2020-04-25 DIAGNOSIS — L570 Actinic keratosis: Secondary | ICD-10-CM

## 2020-04-25 DIAGNOSIS — M25511 Pain in right shoulder: Secondary | ICD-10-CM

## 2020-04-25 DIAGNOSIS — M545 Low back pain, unspecified: Secondary | ICD-10-CM

## 2020-04-25 DIAGNOSIS — G8929 Other chronic pain: Secondary | ICD-10-CM

## 2020-04-25 MED ORDER — RIVASTIGMINE TARTRATE 3 MG PO CAPS: 3.0000 mg | ORAL_CAPSULE | Freq: Two times a day (BID) | ORAL | 3 refills | Status: DC

## 2020-04-26 ENCOUNTER — Encounter: Payer: Self-pay | Admitting: Dermatology

## 2020-04-26 NOTE — Progress Notes (Signed)
   Follow-Up Visit   Subjective  Kerri Simmons is a 86 y.o. female who presents for the following: Skin Problem (6 month follow up SCC). Skin cancer Location: Left forehead Duration:  Quality: Stable Associated Signs/Symptoms: Modifying Factors:  Severity:  Timing: Context:   The following portions of the chart were reviewed this encounter and updated as appropriate:     Objective  Well appearing patient in no apparent distress; mood and affect are within normal limits.  A focused examination was performed including Head and neck.. Relevant physical exam findings are noted in the Assessment and Plan.   Assessment & Plan  SCCA (squamous cell carcinoma) of skin Left Forehead  General skin check annually, recheck forehead if there is clinical worsening.  AK (actinic keratosis) Left [place]  If worsen may do PDT in the late fall.

## 2020-04-27 ENCOUNTER — Other Ambulatory Visit: Payer: Self-pay | Admitting: Family Medicine

## 2020-04-28 ENCOUNTER — Encounter: Payer: Self-pay | Admitting: Physical Therapy

## 2020-04-28 NOTE — Therapy (Signed)
[facility] [address] [place], [place], [address] Phone: [phone]   Fax:  [phone]  Physical Therapy Treatment  Patient Details  Name: Kerri Simmons MRN: [id number] Date of Birth: 01-06-1926 Referring Provider (PT): caleb Jerline Pain   Encounter Date: 04/25/2020   PT End of Session - 04/28/20 1431    Visit Number 2    Number of Visits 12    Date for PT Re-Evaluation 06/01/20    Authorization Type Medicare    PT Start Time 1346    PT Stop Time 6712    PT Time Calculation (min) 42 min    Activity Tolerance Patient tolerated treatment well    Behavior During Therapy [facility] for tasks assessed/performed           Past Medical History:  Diagnosis Date   Allergy    Arthritis    Cancer ([place])    Essential hypertension 04/19/2019   GERD (gastroesophageal reflux disease) 04/19/2019   Glaucoma 04/19/2019   Hyperlipidemia    Hypothyroidism 04/19/2019   IBS (irritable bowel syndrome) 04/19/2019   Macular degeneration, left eye 10/21/2008   Migraines    Orthostasis 04/19/2019   Pneumonia    Thyroid disease     Past Surgical History:  Procedure Laterality Date   ABDOMINAL HYSTERECTOMY     FETAL BLOOD TRANSFUSION     SQUAMOUS CELL CARCINOMA EXCISION Left    upper left arm   THYROID LOBECTOMY      There were no vitals filed for this visit.   Subjective Assessment - 04/28/20 1430    Subjective Pt with no new complaints, has been doing HEP    Currently in Pain? Yes    Pain Score 5     Pain Location Shoulder    Pain Orientation Right    Pain Descriptors / Indicators Aching    Pain Type Acute pain    Pain Onset 1 to 4 weeks ago    Pain Frequency Intermittent    Pain Score 4    Pain Location Back    Pain Orientation Right;Left    Pain Descriptors / Indicators Aching    Pain Type Chronic pain    Pain Onset More than a month ago                             [facility] Adult PT Treatment/Exercise -  04/28/20 0001      Ambulation/Gait   Gait Comments 35 ft x 8 with education on improving step height as well as optimal use of SPC .       Lumbar Exercises: Supine   Pelvic Tilt 15 reps    Clam 15 reps    Bent Knee Raise 20 reps      Shoulder Exercises: Supine   Flexion AAROM;15 reps    Flexion Limitations cane      Shoulder Exercises: Seated   Retraction 20 reps      Shoulder Exercises: Pulleys   Flexion 2 minutes      Manual Therapy   Manual Therapy Passive ROM;Manual Traction    Passive ROM PROM for R shoulder all motions;  Passive hamstring stretches and piriformis stretches    Manual Traction Long leg distraction bil for lumbar pump x 3 min;                     PT Short Term Goals - 04/24/20 1916      PT  SHORT TERM GOAL #1   Title Pt to be independent with initial HEP    Time 2    Period Weeks    Status New    Target Date 05/04/20      PT SHORT TERM GOAL #2   Title Pt to demo ability to self correct posture, for optimal seated lumbar posture.    Time 2    Period Weeks    Status New    Target Date 05/04/20             PT Long Term Goals - 04/24/20 1917      PT LONG TERM GOAL #1   Title Pt to be independent with final HEP for shoulder and back    Time 6    Period Weeks    Status New    Target Date 06/01/20      PT LONG TERM GOAL #2   Title Pt to demo ability to self correct lumbar posture at least 75% of the time in clinic.    Time 6    Period Weeks    Status New    Target Date 06/01/20      PT LONG TERM GOAL #3   Title Pt to demo improved AROM of R shoulder to at least 120 deg for elevation, to improve ability for ADLs and IADLs.    Time 6    Period Weeks    Status New    Target Date 06/01/20      PT LONG TERM GOAL #4   Title Pt to report decreased pain in back to 0-2/10 with standing , sitting, and activity    Time 6    Period Weeks    Status New    Target Date 06/01/20      PT LONG TERM GOAL #5   Title Pt to report  decreased pain in R shoulder, to 0-2/10 with activity and ADLs.    Time 6    Period Weeks    Status New    Target Date 06/01/20                 Plan - 04/28/20 1446    Clinical Impression Statement Ther ex progressed for strengthening of hips and core. Pt with painful R shoulder, ROM and light manual performed today. Continued education and practice with optimal posture during session, as well as cueing for imroving step height and safety with gait.    Personal Factors and Comorbidities Age;Comorbidity 1    Comorbidities R shoulder pain, Back pain, Poor balance, CA,    Examination-Activity Limitations Reach Overhead;Squat;Carry;Dressing;Lift;Locomotion Level    Examination-Participation Restrictions Meal Prep;Cleaning;Community Activity;Shop    Stability/Clinical Decision Making Evolving/Moderate complexity    Rehab Potential Good    PT Frequency 2x / week    PT Duration 6 weeks    PT Treatment/Interventions ADLs/Self Care Home Management;Cryotherapy;Electrical Stimulation;Gait training;DME Instruction;Ultrasound;Moist Heat;Iontophoresis 4mg /ml Dexamethasone;Stair training;Traction;Functional mobility training;Therapeutic activities;Therapeutic exercise;Neuromuscular re-education;Balance training;Manual techniques;Patient/family education;Passive range of motion;Dry needling;Vasopneumatic Device;Taping;Spinal Manipulations;Joint Manipulations    PT Home Exercise Plan [id number]    Consulted and Agree with Plan of Care Patient;Family member/caregiver           Patient will benefit from skilled therapeutic intervention in order to improve the following deficits and impairments:  Abnormal gait, Decreased range of motion, Difficulty walking, Impaired UE functional use, Increased muscle spasms, Decreased endurance, Decreased activity tolerance, Pain, Improper body mechanics, Impaired flexibility, Hypomobility, Decreased balance, Decreased knowledge of use of DME, Decreased mobility,  Decreased strength, Postural dysfunction  Visit Diagnosis: Chronic bilateral low back pain without sciatica  Acute pain of right shoulder     Problem List Patient Active Problem List   Diagnosis Date Noted   Constipation 03/14/2020   Low back pain 03/14/2020   Mild neurocognitive disorder 08/19/2019   Orthostasis 04/19/2019   Essential hypertension 04/19/2019   Hypothyroidism 04/19/2019   Overactive bladder 04/19/2019   IBS (irritable bowel syndrome) 04/19/2019   GERD (gastroesophageal reflux disease) 04/19/2019   Dyslipidemia 04/19/2019   Glaucoma 04/19/2019   Macular degeneration, left eye 10/21/2008    Lyndee Hensen, PT, DPT 2:47 PM  04/28/20    [facility] [address] [place], [place], [address] Phone: [phone]   Fax:  [phone]  Name: Kerri Simmons MRN: [id number] Date of Birth: Aug 26, 1926

## 2020-05-01 ENCOUNTER — Encounter: Payer: Self-pay | Admitting: Physical Therapy

## 2020-05-01 ENCOUNTER — Other Ambulatory Visit: Payer: Self-pay

## 2020-05-01 ENCOUNTER — Ambulatory Visit: Payer: Medicare Other | Admitting: Physical Therapy

## 2020-05-01 ENCOUNTER — Ambulatory Visit (INDEPENDENT_AMBULATORY_CARE_PROVIDER_SITE_OTHER): Payer: Medicare Other | Admitting: Physical Therapy

## 2020-05-01 DIAGNOSIS — M545 Low back pain: Secondary | ICD-10-CM | POA: Diagnosis not present

## 2020-05-01 DIAGNOSIS — G8929 Other chronic pain: Secondary | ICD-10-CM

## 2020-05-01 DIAGNOSIS — M25511 Pain in right shoulder: Secondary | ICD-10-CM

## 2020-05-01 NOTE — Therapy (Signed)
[facility] [address] [place], [place], [address] Phone: [phone]   Fax:  [phone]  Physical Therapy Treatment  Patient Details  Name: Kerri Simmons MRN: [id number] Date of Birth: 26-Oct-1925 Referring Provider (PT): caleb Jerline Pain   Encounter Date: 05/01/2020   PT End of Session - 05/01/20 2139    Visit Number 3    Number of Visits 12    Date for PT Re-Evaluation 06/01/20    Authorization Type Medicare    PT Start Time 7408    PT Stop Time 1557    PT Time Calculation (min) 41 min    Activity Tolerance Patient tolerated treatment well    Behavior During Therapy [facility] for tasks assessed/performed           Past Medical History:  Diagnosis Date  . Allergy   . Arthritis   . Cancer ([place])   . Essential hypertension 04/19/2019  . GERD (gastroesophageal reflux disease) 04/19/2019  . Glaucoma 04/19/2019  . Hyperlipidemia   . Hypothyroidism 04/19/2019  . IBS (irritable bowel syndrome) 04/19/2019  . Macular degeneration, left eye 10/21/2008  . Migraines   . Orthostasis 04/19/2019  . Pneumonia   . Thyroid disease     Past Surgical History:  Procedure Laterality Date  . ABDOMINAL HYSTERECTOMY    . FETAL BLOOD TRANSFUSION    . SQUAMOUS CELL CARCINOMA EXCISION Left    upper left arm  . THYROID LOBECTOMY      There were no vitals filed for this visit.   Subjective Assessment - 05/01/20 2137    Subjective Pts daughter reports fall last week, unsure of cause, thinks pt was reaching for something, Reports mild bruise on R knee, otherwise no pain from fall. pt still using SPC not walker. Reports continued back pain and shoulder pain    Patient Stated Goals decreased pain in R shoulder and low back    Currently in Pain? Yes    Pain Score 5     Pain Location Shoulder    Pain Orientation Right    Pain Descriptors / Indicators Aching    Pain Type Acute pain    Pain Onset 1 to 4 weeks ago    Pain Frequency Intermittent    Pain  Score 4    Pain Location Back    Pain Orientation Right;Left    Pain Descriptors / Indicators Aching    Pain Type Chronic pain    Pain Onset More than a month ago    Pain Frequency Intermittent                             [facility] Adult PT Treatment/Exercise - 05/01/20 0001      Ambulation/Gait   Gait Comments 35 ft x 6 with RW , with education on proper mechanics and use.       Self-Care   Self-Care Posture    Posture Education on optimal seated posture for low back, and upper back, neck for back and neck pain.       Lumbar Exercises: Standing   Other Standing Lumbar Exercises L/R, A/P and staggered stance weight shifts x 20 each (CGA)       Shoulder Exercises: Supine   Flexion AAROM;15 reps    Flexion Limitations cane      Shoulder Exercises: Seated   Retraction 20 reps    External Rotation 10 reps;Both    Theraband Level (Shoulder External Rotation) Level 1 (  Yellow)      Shoulder Exercises: Standing   Flexion Strengthening;10 reps    Flexion Limitations cane      Manual Therapy   Manual Therapy Joint mobilization;Soft tissue mobilization    Soft tissue mobilization STM to R UT, levator and posterior shoulder     Passive ROM PROM for R shoulder all motions;  Passive hamstring stretches and piriformis stretches                  PT Education - 05/01/20 2138    Education Details HEP Reviewed    Person(s) Educated Patient            PT Short Term Goals - 04/24/20 1916      PT SHORT TERM GOAL #1   Title Pt to be independent with initial HEP    Time 2    Period Weeks    Status New    Target Date 05/04/20      PT SHORT TERM GOAL #2   Title Pt to demo ability to self correct posture, for optimal seated lumbar posture.    Time 2    Period Weeks    Status New    Target Date 05/04/20             PT Long Term Goals - 04/24/20 1917      PT LONG TERM GOAL #1   Title Pt to be independent with final HEP for shoulder and back    Time  6    Period Weeks    Status New    Target Date 06/01/20      PT LONG TERM GOAL #2   Title Pt to demo ability to self correct lumbar posture at least 75% of the time in clinic.    Time 6    Period Weeks    Status New    Target Date 06/01/20      PT LONG TERM GOAL #3   Title Pt to demo improved AROM of R shoulder to at least 120 deg for elevation, to improve ability for ADLs and IADLs.    Time 6    Period Weeks    Status New    Target Date 06/01/20      PT LONG TERM GOAL #4   Title Pt to report decreased pain in back to 0-2/10 with standing , sitting, and activity    Time 6    Period Weeks    Status New    Target Date 06/01/20      PT LONG TERM GOAL #5   Title Pt to report decreased pain in R shoulder, to 0-2/10 with activity and ADLs.    Time 6    Period Weeks    Status New    Target Date 06/01/20                 Plan - 05/01/20 2141    Clinical Impression Statement Education and practice for optimal seated posture today, pt with increased kyphosis that is likley also contributing to pain in R upper trap region. Pt with tightness and soreness in upper trap, levator and posterior shoulder today with STM. Started light balance exercises today, as well as education on proper use of RW, would recommend that pt start to use walker.    Personal Factors and Comorbidities Age;Comorbidity 1    Comorbidities R shoulder pain, Back pain, Poor balance, CA,    Examination-Activity Limitations Reach Overhead;Squat;Carry;Dressing;Lift;Locomotion Level    Examination-Participation Restrictions Meal  Prep;Cleaning;Community Activity;Shop    Stability/Clinical Decision Making Evolving/Moderate complexity    Rehab Potential Good    PT Frequency 2x / week    PT Duration 6 weeks    PT Treatment/Interventions ADLs/Self Care Home Management;Cryotherapy;Electrical Stimulation;Gait training;DME Instruction;Ultrasound;Moist Heat;Iontophoresis 4mg /ml Dexamethasone;Stair  training;Traction;Functional mobility training;Therapeutic activities;Therapeutic exercise;Neuromuscular re-education;Balance training;Manual techniques;Patient/family education;Passive range of motion;Dry needling;Vasopneumatic Device;Taping;Spinal Manipulations;Joint Manipulations    PT Home Exercise Plan [id number]    Consulted and Agree with Plan of Care Patient;Family member/caregiver           Patient will benefit from skilled therapeutic intervention in order to improve the following deficits and impairments:  Abnormal gait, Decreased range of motion, Difficulty walking, Impaired UE functional use, Increased muscle spasms, Decreased endurance, Decreased activity tolerance, Pain, Improper body mechanics, Impaired flexibility, Hypomobility, Decreased balance, Decreased knowledge of use of DME, Decreased mobility, Decreased strength, Postural dysfunction  Visit Diagnosis: Chronic bilateral low back pain without sciatica  Acute pain of right shoulder     Problem List Patient Active Problem List   Diagnosis Date Noted  . Constipation 03/14/2020  . Low back pain 03/14/2020  . Mild neurocognitive disorder 08/19/2019  . Orthostasis 04/19/2019  . Essential hypertension 04/19/2019  . Hypothyroidism 04/19/2019  . Overactive bladder 04/19/2019  . IBS (irritable bowel syndrome) 04/19/2019  . GERD (gastroesophageal reflux disease) 04/19/2019  . Dyslipidemia 04/19/2019  . Glaucoma 04/19/2019  . Macular degeneration, left eye 10/21/2008    Lyndee Hensen, PT, DPT 9:44 PM  05/01/20    [facility] [place], [place], [address] Phone: [phone]   Fax:  [phone]  Name: Kerri Simmons MRN: [id number] Date of Birth: 1926/09/14

## 2020-05-03 ENCOUNTER — Ambulatory Visit (INDEPENDENT_AMBULATORY_CARE_PROVIDER_SITE_OTHER): Payer: Medicare Other | Admitting: Physical Therapy

## 2020-05-03 ENCOUNTER — Other Ambulatory Visit: Payer: Self-pay

## 2020-05-03 ENCOUNTER — Encounter: Payer: Self-pay | Admitting: Physical Therapy

## 2020-05-03 DIAGNOSIS — G8929 Other chronic pain: Secondary | ICD-10-CM

## 2020-05-03 DIAGNOSIS — R2689 Other abnormalities of gait and mobility: Secondary | ICD-10-CM | POA: Diagnosis not present

## 2020-05-03 DIAGNOSIS — M25511 Pain in right shoulder: Secondary | ICD-10-CM | POA: Diagnosis not present

## 2020-05-03 DIAGNOSIS — M545 Low back pain: Secondary | ICD-10-CM

## 2020-05-03 NOTE — Therapy (Signed)
[facility] [address] [place], [place], [address] Phone: [phone]   Fax:  [phone]  Physical Therapy Treatment  Patient Details  Name: Kerri Simmons MRN: [id number] Date of Birth: 1926/05/08 Referring Provider (PT): caleb Jerline Pain   Encounter Date: 05/03/2020   PT End of Session - 05/03/20 1616    Visit Number 4    Number of Visits 12    Date for PT Re-Evaluation 06/01/20    Authorization Type Medicare    PT Start Time 8413    PT Stop Time 1559    PT Time Calculation (min) 43 min    Activity Tolerance Patient tolerated treatment well    Behavior During Therapy [facility] for tasks assessed/performed           Past Medical History:  Diagnosis Date  . Allergy   . Arthritis   . Cancer ([place])   . Essential hypertension 04/19/2019  . GERD (gastroesophageal reflux disease) 04/19/2019  . Glaucoma 04/19/2019  . Hyperlipidemia   . Hypothyroidism 04/19/2019  . IBS (irritable bowel syndrome) 04/19/2019  . Macular degeneration, left eye 10/21/2008  . Migraines   . Orthostasis 04/19/2019  . Pneumonia   . Thyroid disease     Past Surgical History:  Procedure Laterality Date  . ABDOMINAL HYSTERECTOMY    . FETAL BLOOD TRANSFUSION    . SQUAMOUS CELL CARCINOMA EXCISION Left    upper left arm  . THYROID LOBECTOMY      There were no vitals filed for this visit.   Subjective Assessment - 05/03/20 1615    Subjective Pt states back is sore today, but was a bit better yesterday. Has 4 WW with her today, does not usually use.    Currently in Pain? Yes    Pain Score 3     Pain Location Shoulder    Pain Orientation Right    Pain Descriptors / Indicators Aching    Pain Type Acute pain    Pain Onset 1 to 4 weeks ago    Pain Frequency Intermittent    Pain Score 4    Pain Location Back    Pain Orientation Right;Left    Pain Descriptors / Indicators Aching    Pain Type Chronic pain    Pain Onset More than a month ago    Pain Frequency  Intermittent                             [facility] Adult PT Treatment/Exercise - 05/03/20 0001      Ambulation/Gait   Gait Comments 35 ft x 6 with 4WW , with education on proper mechanics and use.       Self-Care   Posture Education on optimal seated posture for low back, and upper back, neck for back and neck pain.       Lumbar Exercises: Stretches   Active Hamstring Stretch Limitations add next visit- seated       Lumbar Exercises: Standing   Heel Raises 15 reps    Other Standing Lumbar Exercises March, HIp abd and HR x 20       Lumbar Exercises: Seated   Sit to Stand 10 reps    Sit to Stand Limitations Education on set up and fwd lean for improved back posture       Lumbar Exercises: Supine   Other Supine Lumbar Exercises Supine hip ext iso with same arm overhead/leg lengthener 5 sec x 10 bil;  Shoulder Exercises: Supine   External Rotation 15 reps    External Rotation Weight (lbs) 2    Flexion AAROM;15 reps    Flexion Limitations cane      Shoulder Exercises: Seated   Row 20 reps    Theraband Level (Shoulder Row) Level 2 (Red)      Manual Therapy   Passive ROM PROM for R shoulder all motions;  Passive hamstring stretches and piriformis stretches    Manual Traction Long leg distraction bil for lumbar pump x 3 min;                     PT Short Term Goals - 04/24/20 1916      PT SHORT TERM GOAL #1   Title Pt to be independent with initial HEP    Time 2    Period Weeks    Status New    Target Date 05/04/20      PT SHORT TERM GOAL #2   Title Pt to demo ability to self correct posture, for optimal seated lumbar posture.    Time 2    Period Weeks    Status New    Target Date 05/04/20             PT Long Term Goals - 04/24/20 1917      PT LONG TERM GOAL #1   Title Pt to be independent with final HEP for shoulder and back    Time 6    Period Weeks    Status New    Target Date 06/01/20      PT LONG TERM GOAL #2   Title Pt  to demo ability to self correct lumbar posture at least 75% of the time in clinic.    Time 6    Period Weeks    Status New    Target Date 06/01/20      PT LONG TERM GOAL #3   Title Pt to demo improved AROM of R shoulder to at least 120 deg for elevation, to improve ability for ADLs and IADLs.    Time 6    Period Weeks    Status New    Target Date 06/01/20      PT LONG TERM GOAL #4   Title Pt to report decreased pain in back to 0-2/10 with standing , sitting, and activity    Time 6    Period Weeks    Status New    Target Date 06/01/20      PT LONG TERM GOAL #5   Title Pt to report decreased pain in R shoulder, to 0-2/10 with activity and ADLs.    Time 6    Period Weeks    Status New    Target Date 06/01/20                 Plan - 05/03/20 1618    Clinical Impression Statement Education on use of pts 4WW today, pt with improved gait mechanics and efficiency, recommended she use at home. Educated on standing LE strengthening, HEP updated. Pt to benefit from continued Strengthening , posture, balance gait, shoulder pain and back pain.    Personal Factors and Comorbidities Age;Comorbidity 1    Comorbidities R shoulder pain, Back pain, Poor balance, CA,    Examination-Activity Limitations Reach Overhead;Squat;Carry;Dressing;Lift;Locomotion Level    Examination-Participation Restrictions Meal Prep;Cleaning;Community Activity;Shop    Stability/Clinical Decision Making Evolving/Moderate complexity    Rehab Potential Good    PT Frequency 2x /  week    PT Duration 6 weeks    PT Treatment/Interventions ADLs/Self Care Home Management;Cryotherapy;Electrical Stimulation;Gait training;DME Instruction;Ultrasound;Moist Heat;Iontophoresis 4mg /ml Dexamethasone;Stair training;Traction;Functional mobility training;Therapeutic activities;Therapeutic exercise;Neuromuscular re-education;Balance training;Manual techniques;Patient/family education;Passive range of motion;Dry needling;Vasopneumatic  Device;Taping;Spinal Manipulations;Joint Manipulations    PT Home Exercise Plan [id number]    Consulted and Agree with Plan of Care Patient;Family member/caregiver           Patient will benefit from skilled therapeutic intervention in order to improve the following deficits and impairments:  Abnormal gait, Decreased range of motion, Difficulty walking, Impaired UE functional use, Increased muscle spasms, Decreased endurance, Decreased activity tolerance, Pain, Improper body mechanics, Impaired flexibility, Hypomobility, Decreased balance, Decreased knowledge of use of DME, Decreased mobility, Decreased strength, Postural dysfunction  Visit Diagnosis: Chronic bilateral low back pain without sciatica  Acute pain of right shoulder  Other abnormalities of gait and mobility     Problem List Patient Active Problem List   Diagnosis Date Noted  . Constipation 03/14/2020  . Low back pain 03/14/2020  . Mild neurocognitive disorder 08/19/2019  . Orthostasis 04/19/2019  . Essential hypertension 04/19/2019  . Hypothyroidism 04/19/2019  . Overactive bladder 04/19/2019  . IBS (irritable bowel syndrome) 04/19/2019  . GERD (gastroesophageal reflux disease) 04/19/2019  . Dyslipidemia 04/19/2019  . Glaucoma 04/19/2019  . Macular degeneration, left eye 10/21/2008    Lyndee Hensen, PT, DPT 4:20 PM  05/03/20    [facility] [place], [place], [address] Phone: [phone]   Fax:  [phone]  Name: Kerri Simmons MRN: [id number] Date of Birth: 08-13-1926

## 2020-05-09 ENCOUNTER — Encounter: Payer: Medicare Other | Admitting: Physical Therapy

## 2020-05-10 ENCOUNTER — Encounter: Payer: Medicare Other | Admitting: Physical Therapy

## 2020-05-15 ENCOUNTER — Ambulatory Visit (INDEPENDENT_AMBULATORY_CARE_PROVIDER_SITE_OTHER): Payer: Medicare Other | Admitting: Physical Therapy

## 2020-05-15 ENCOUNTER — Other Ambulatory Visit: Payer: Self-pay

## 2020-05-15 DIAGNOSIS — G8929 Other chronic pain: Secondary | ICD-10-CM

## 2020-05-15 DIAGNOSIS — M545 Low back pain, unspecified: Secondary | ICD-10-CM

## 2020-05-15 DIAGNOSIS — M25511 Pain in right shoulder: Secondary | ICD-10-CM | POA: Diagnosis not present

## 2020-05-15 DIAGNOSIS — R2689 Other abnormalities of gait and mobility: Secondary | ICD-10-CM

## 2020-05-16 ENCOUNTER — Encounter: Payer: Self-pay | Admitting: Physical Therapy

## 2020-05-16 NOTE — Therapy (Signed)
[facility] [address] [place], [place], [address] Phone: [phone]   Fax:  [phone]  Physical Therapy Treatment  Patient Details  Name: Kerri Simmons MRN: [id number] Date of Birth: [birth year] Referring Provider (PT): caleb Jerline Pain   Encounter Date: 05/15/2020   PT End of Session - 05/16/20 1231    Visit Number 5    Number of Visits 12    Date for PT Re-Evaluation 06/01/20    Authorization Type Medicare    PT Start Time 1603    PT Stop Time 1645    PT Time Calculation (min) 42 min    Activity Tolerance Patient tolerated treatment well    Behavior During Therapy [facility] for tasks assessed/performed           Past Medical History:  Diagnosis Date  . Allergy   . Arthritis   . Cancer ([place])   . Essential hypertension 04/19/2019  . GERD (gastroesophageal reflux disease) 04/19/2019  . Glaucoma 04/19/2019  . Hyperlipidemia   . Hypothyroidism 04/19/2019  . IBS (irritable bowel syndrome) 04/19/2019  . Macular degeneration, left eye 10/21/2008  . Migraines   . Orthostasis 04/19/2019  . Pneumonia   . Thyroid disease     Past Surgical History:  Procedure Laterality Date  . ABDOMINAL HYSTERECTOMY    . FETAL BLOOD TRANSFUSION    . SQUAMOUS CELL CARCINOMA EXCISION Left    upper left arm  . THYROID LOBECTOMY      There were no vitals filed for this visit.   Subjective Assessment - 05/16/20 1230    Subjective Pt states back is less painful today, R UT is quite sore    Currently in Pain? Yes    Pain Score 5     Pain Location Shoulder    Pain Orientation Right    Pain Descriptors / Indicators Aching    Pain Type Acute pain    Pain Onset 1 to 4 weeks ago    Pain Frequency Intermittent    Pain Score 3    Pain Location Back    Pain Orientation Right;Left    Pain Descriptors / Indicators Aching    Pain Type Chronic pain    Pain Onset More than a month ago    Pain Frequency Intermittent                              [facility] Adult PT Treatment/Exercise - 05/16/20 0001      Ambulation/Gait   Gait Comments 35 ft x 6 with 4WW , with education on proper mechanics , foot clearance, and direction changes      Self-Care   Posture --      Lumbar Exercises: Stretches   Active Hamstring Stretch Limitations add next visit- seated       Lumbar Exercises: Standing   Heel Raises --    Other Standing Lumbar Exercises --      Lumbar Exercises: Seated   Sit to Stand 15 reps    Sit to Stand Limitations Education on set up and fwd lean for improved back posture       Lumbar Exercises: Supine   Ab Set 10 reps    Pelvic Tilt 15 reps    Bent Knee Raise 20 reps    Other Supine Lumbar Exercises Supine hip ext iso with same arm overhead/leg lengthener 5 sec x 10 bil;     Other Supine Lumbar Exercises hip add iso  with TA x 10;  Clams GTB x 10 with TA      Shoulder Exercises: Supine   External Rotation --    External Rotation Weight (lbs) --    Flexion --    Flexion Limitations --      Shoulder Exercises: Seated   Row 20 reps    Theraband Level (Shoulder Row) Level 2 (Red)      Manual Therapy   Soft tissue mobilization DTM to R UT and levator     Passive ROM PROM for R shoulder all motions;      Manual Traction --                    PT Short Term Goals - 04/24/20 1916      PT SHORT TERM GOAL #1   Title Pt to be independent with initial HEP    Time 2    Period Weeks    Status New    Target Date 05/04/20      PT SHORT TERM GOAL #2   Title Pt to demo ability to self correct posture, for optimal seated lumbar posture.    Time 2    Period Weeks    Status New    Target Date 05/04/20             PT Long Term Goals - 04/24/20 1917      PT LONG TERM GOAL #1   Title Pt to be independent with final HEP for shoulder and back    Time 6    Period Weeks    Status New    Target Date 06/01/20      PT LONG TERM GOAL #2   Title Pt to demo ability to self  correct lumbar posture at least 75% of the time in clinic.    Time 6    Period Weeks    Status New    Target Date 06/01/20      PT LONG TERM GOAL #3   Title Pt to demo improved AROM of R shoulder to at least 120 deg for elevation, to improve ability for ADLs and IADLs.    Time 6    Period Weeks    Status New    Target Date 06/01/20      PT LONG TERM GOAL #4   Title Pt to report decreased pain in back to 0-2/10 with standing , sitting, and activity    Time 6    Period Weeks    Status New    Target Date 06/01/20      PT LONG TERM GOAL #5   Title Pt to report decreased pain in R shoulder, to 0-2/10 with activity and ADLs.    Time 6    Period Weeks    Status New    Target Date 06/01/20                 Plan - 05/16/20 1233    Clinical Impression Statement Pt with improving foot clearance, speed and efficiency with use of RW vs cane. R UT/levator quite sore with manual today, pt with trigger point and hypertonicity likley due to posture. Continued education on importance of lumbar posture for back pain. Much time spent today on mechanics of sit to stand, pt able to perform correctly after education and practice. Will likley need to continue to work on this.    Personal Factors and Comorbidities Age;Comorbidity 1    Comorbidities R shoulder pain, Back pain,  Poor balance, CA,    Examination-Activity Limitations Reach Overhead;Squat;Carry;Dressing;Lift;Locomotion Level    Examination-Participation Restrictions Meal Prep;Cleaning;Community Activity;Shop    Stability/Clinical Decision Making Evolving/Moderate complexity    Rehab Potential Good    PT Frequency 2x / week    PT Duration 6 weeks    PT Treatment/Interventions ADLs/Self Care Home Management;Cryotherapy;Electrical Stimulation;Gait training;DME Instruction;Ultrasound;Moist Heat;Iontophoresis 4mg /ml Dexamethasone;Stair training;Traction;Functional mobility training;Therapeutic activities;Therapeutic exercise;Neuromuscular  re-education;Balance training;Manual techniques;Patient/family education;Passive range of motion;Dry needling;Vasopneumatic Device;Taping;Spinal Manipulations;Joint Manipulations    PT Home Exercise Plan [email or web address]    Consulted and Agree with Plan of Care Patient;Family member/caregiver           Patient will benefit from skilled therapeutic intervention in order to improve the following deficits and impairments:  Abnormal gait, Decreased range of motion, Difficulty walking, Impaired UE functional use, Increased muscle spasms, Decreased endurance, Decreased activity tolerance, Pain, Improper body mechanics, Impaired flexibility, Hypomobility, Decreased balance, Decreased knowledge of use of DME, Decreased mobility, Decreased strength, Postural dysfunction  Visit Diagnosis: Chronic bilateral low back pain without sciatica  Acute pain of right shoulder  Other abnormalities of gait and mobility     Problem List Patient Active Problem List   Diagnosis Date Noted  . Constipation 03/14/2020  . Low back pain 03/14/2020  . Mild neurocognitive disorder 08/19/2019  . Orthostasis 04/19/2019  . Essential hypertension 04/19/2019  . Hypothyroidism 04/19/2019  . Overactive bladder 04/19/2019  . IBS (irritable bowel syndrome) 04/19/2019  . GERD (gastroesophageal reflux disease) 04/19/2019  . Dyslipidemia 04/19/2019  . Glaucoma 04/19/2019  . Macular degeneration, left eye 10/21/2008    Lyndee Hensen, PT, DPT 12:35 PM  05/16/20   [facility] [place], [place], [address] Phone: [phone]   Fax:  [phone]  Name: Kerri Simmons MRN: [id number] Date of Birth: [birth year]

## 2020-05-18 ENCOUNTER — Other Ambulatory Visit: Payer: Self-pay

## 2020-05-18 ENCOUNTER — Encounter: Payer: Self-pay | Admitting: Physical Therapy

## 2020-05-18 ENCOUNTER — Ambulatory Visit (INDEPENDENT_AMBULATORY_CARE_PROVIDER_SITE_OTHER): Payer: Medicare Other | Admitting: Physical Therapy

## 2020-05-18 DIAGNOSIS — M545 Low back pain: Secondary | ICD-10-CM | POA: Diagnosis not present

## 2020-05-18 DIAGNOSIS — G8929 Other chronic pain: Secondary | ICD-10-CM

## 2020-05-18 DIAGNOSIS — M25511 Pain in right shoulder: Secondary | ICD-10-CM | POA: Diagnosis not present

## 2020-05-18 DIAGNOSIS — R2689 Other abnormalities of gait and mobility: Secondary | ICD-10-CM | POA: Diagnosis not present

## 2020-05-21 ENCOUNTER — Encounter: Payer: Self-pay | Admitting: Physical Therapy

## 2020-05-21 NOTE — Therapy (Signed)
[facility] [address] [place], [place], [address] Phone: [phone]   Fax:  [phone]  Physical Therapy Treatment  Patient Details  Name: Kerri Simmons MRN: [id number] Date of Birth: October 07, 1926 Referring Provider (PT): caleb Jerline Pain   Encounter Date: 05/18/2020   PT End of Session - 05/21/20 1529    Visit Number 6    Number of Visits 12    Date for PT Re-Evaluation 06/01/20    Authorization Type Medicare    PT Start Time 1600    PT Stop Time 3299    PT Time Calculation (min) 41 min    Activity Tolerance Patient tolerated treatment well    Behavior During Therapy [facility] for tasks assessed/performed           Past Medical History:  Diagnosis Date  . Allergy   . Arthritis   . Cancer ([place])   . Essential hypertension 04/19/2019  . GERD (gastroesophageal reflux disease) 04/19/2019  . Glaucoma 04/19/2019  . Hyperlipidemia   . Hypothyroidism 04/19/2019  . IBS (irritable bowel syndrome) 04/19/2019  . Macular degeneration, left eye 10/21/2008  . Migraines   . Orthostasis 04/19/2019  . Pneumonia   . Thyroid disease     Past Surgical History:  Procedure Laterality Date  . ABDOMINAL HYSTERECTOMY    . FETAL BLOOD TRANSFUSION    . SQUAMOUS CELL CARCINOMA EXCISION Left    upper left arm  . THYROID LOBECTOMY      There were no vitals filed for this visit.   Subjective Assessment - 05/21/20 1528    Subjective Pt states back and R UT less sore today    Patient Stated Goals decreased pain in R shoulder and low back    Currently in Pain? Yes    Pain Score 4     Pain Location Shoulder    Pain Orientation Right    Pain Descriptors / Indicators Aching    Pain Type Acute pain    Pain Onset 1 to 4 weeks ago    Pain Frequency Intermittent    Pain Score 3    Pain Location Back    Pain Orientation Right;Left    Pain Descriptors / Indicators Aching    Pain Type Chronic pain    Pain Onset More than a month ago    Pain Frequency  Intermittent                             [facility] Adult PT Treatment/Exercise - 05/21/20 0001      Ambulation/Gait   Gait Comments 35 ft x 6 with 4WW , with education on proper mechanics , foot clearance, and direction changes      Lumbar Exercises: Stretches   Active Hamstring Stretch Limitations --      Lumbar Exercises: Standing   Other Standing Lumbar Exercises March, HIp abd and HR x 20     Other Standing Lumbar Exercises Side stepping 15 ft x 4:   fwd and bwd stepping with weight shifting (CGA) x 20 each bil;       Lumbar Exercises: Seated   Sit to Stand 10 reps    Sit to Stand Limitations Education on set up and fwd lean for improved back posture       Lumbar Exercises: Supine   Ab Set 10 reps    Pelvic Tilt 15 reps    Bent Knee Raise 20 reps    Bridge 10 reps  Other Supine Lumbar Exercises --    Other Supine Lumbar Exercises hip add iso with TA x 10;  Clams GTB x 10 with TA      Shoulder Exercises: Supine   Flexion AAROM;10 reps    Flexion Limitations cane      Shoulder Exercises: Seated   Row 20 reps    Theraband Level (Shoulder Row) Level 2 (Red)      Manual Therapy   Soft tissue mobilization DTM to R UT and levator     Passive ROM PROM for R shoulder all motions;                      PT Short Term Goals - 04/24/20 1916      PT SHORT TERM GOAL #1   Title Pt to be independent with initial HEP    Time 2    Period Weeks    Status New    Target Date 05/04/20      PT SHORT TERM GOAL #2   Title Pt to demo ability to self correct posture, for optimal seated lumbar posture.    Time 2    Period Weeks    Status New    Target Date 05/04/20             PT Long Term Goals - 04/24/20 1917      PT LONG TERM GOAL #1   Title Pt to be independent with final HEP for shoulder and back    Time 6    Period Weeks    Status New    Target Date 06/01/20      PT LONG TERM GOAL #2   Title Pt to demo ability to self correct lumbar  posture at least 75% of the time in clinic.    Time 6    Period Weeks    Status New    Target Date 06/01/20      PT LONG TERM GOAL #3   Title Pt to demo improved AROM of R shoulder to at least 120 deg for elevation, to improve ability for ADLs and IADLs.    Time 6    Period Weeks    Status New    Target Date 06/01/20      PT LONG TERM GOAL #4   Title Pt to report decreased pain in back to 0-2/10 with standing , sitting, and activity    Time 6    Period Weeks    Status New    Target Date 06/01/20      PT LONG TERM GOAL #5   Title Pt to report decreased pain in R shoulder, to 0-2/10 with activity and ADLs.    Time 6    Period Weeks    Status New    Target Date 06/01/20                 Plan - 05/21/20 1532    Clinical Impression Statement Continue to recommend use of RW for ambulation, due to pt with less gait deviations, and improved efficiencey with use vs cane or no AD. Education on posture for shoulder pain. Pt with improving, but continued pain in R UT musculature, DTM done to address. Plan to progress ther ex as tolerated, and contiue pain relief for back and shoulder.    Personal Factors and Comorbidities Age;Comorbidity 1    Comorbidities R shoulder pain, Back pain, Poor balance, CA,    Examination-Activity Limitations Reach Overhead;Squat;Carry;Dressing;Lift;Locomotion Level  Examination-Participation Restrictions Meal Prep;Cleaning;Community Activity;Shop    Stability/Clinical Decision Making Evolving/Moderate complexity    Rehab Potential Good    PT Frequency 2x / week    PT Duration 6 weeks    PT Treatment/Interventions ADLs/Self Care Home Management;Cryotherapy;Electrical Stimulation;Gait training;DME Instruction;Ultrasound;Moist Heat;Iontophoresis 4mg /ml Dexamethasone;Stair training;Traction;Functional mobility training;Therapeutic activities;Therapeutic exercise;Neuromuscular re-education;Balance training;Manual techniques;Patient/family education;Passive  range of motion;Dry needling;Vasopneumatic Device;Taping;Spinal Manipulations;Joint Manipulations    PT Home Exercise Plan [email or web address]    Consulted and Agree with Plan of Care Patient;Family member/caregiver           Patient will benefit from skilled therapeutic intervention in order to improve the following deficits and impairments:  Abnormal gait, Decreased range of motion, Difficulty walking, Impaired UE functional use, Increased muscle spasms, Decreased endurance, Decreased activity tolerance, Pain, Improper body mechanics, Impaired flexibility, Hypomobility, Decreased balance, Decreased knowledge of use of DME, Decreased mobility, Decreased strength, Postural dysfunction  Visit Diagnosis: Chronic bilateral low back pain without sciatica  Acute pain of right shoulder  Other abnormalities of gait and mobility     Problem List Patient Active Problem List   Diagnosis Date Noted  . Constipation 03/14/2020  . Low back pain 03/14/2020  . Mild neurocognitive disorder 08/19/2019  . Orthostasis 04/19/2019  . Essential hypertension 04/19/2019  . Hypothyroidism 04/19/2019  . Overactive bladder 04/19/2019  . IBS (irritable bowel syndrome) 04/19/2019  . GERD (gastroesophageal reflux disease) 04/19/2019  . Dyslipidemia 04/19/2019  . Glaucoma 04/19/2019  . Macular degeneration, left eye 10/21/2008    Lyndee Hensen, PT, DPT 3:40 PM  05/21/20    [facility] [place], [place], [address] Phone: [phone]   Fax:  [phone]  Name: Barbara Ahart MRN: [id number] Date of Birth: 03-27-1926

## 2020-05-23 ENCOUNTER — Ambulatory Visit (INDEPENDENT_AMBULATORY_CARE_PROVIDER_SITE_OTHER): Payer: Medicare Other | Admitting: Physical Therapy

## 2020-05-23 ENCOUNTER — Other Ambulatory Visit: Payer: Self-pay

## 2020-05-23 DIAGNOSIS — M25511 Pain in right shoulder: Secondary | ICD-10-CM

## 2020-05-23 DIAGNOSIS — G8929 Other chronic pain: Secondary | ICD-10-CM | POA: Diagnosis not present

## 2020-05-23 DIAGNOSIS — R2689 Other abnormalities of gait and mobility: Secondary | ICD-10-CM | POA: Diagnosis not present

## 2020-05-23 DIAGNOSIS — M545 Low back pain: Secondary | ICD-10-CM

## 2020-05-25 ENCOUNTER — Ambulatory Visit (INDEPENDENT_AMBULATORY_CARE_PROVIDER_SITE_OTHER): Payer: Medicare Other | Admitting: Physical Therapy

## 2020-05-25 ENCOUNTER — Other Ambulatory Visit: Payer: Self-pay

## 2020-05-25 DIAGNOSIS — M25511 Pain in right shoulder: Secondary | ICD-10-CM

## 2020-05-25 DIAGNOSIS — G8929 Other chronic pain: Secondary | ICD-10-CM | POA: Diagnosis not present

## 2020-05-25 DIAGNOSIS — M545 Low back pain: Secondary | ICD-10-CM | POA: Diagnosis not present

## 2020-05-25 DIAGNOSIS — R2689 Other abnormalities of gait and mobility: Secondary | ICD-10-CM

## 2020-05-25 NOTE — Patient Instructions (Signed)
Access Code: [email or web address] URL: [email or web address] Date: 05/25/2020 Prepared by: Lyndee Hensen  Exercises Supine Shoulder Flexion Extension AAROM with [place] - 2 x daily - 1 sets - 10 reps Supine Posterior Pelvic Tilt - 2 x daily - 1 sets - 10 reps Supine March - 1 x daily - 2 sets - 10 reps Supine Single Knee to Chest Stretch - 2 x daily - 3 reps - 30 hold Seated Correct Posture Seated Scapular Retraction - 2 x daily - 1 sets - 10 reps Sit to Stand - 1 x daily - 1 sets - 5-10 reps Standing March with Counter Support - 1 x daily - 2 sets - 10 reps Seated Sidebending Arms Overhead - 2 x daily - 3 reps - 30 hold Seated Hamstring Stretch - 2 x daily - 3 reps - 30 hold

## 2020-05-29 ENCOUNTER — Encounter: Payer: Medicare Other | Admitting: Physical Therapy

## 2020-05-31 ENCOUNTER — Encounter: Payer: Medicare Other | Admitting: Physical Therapy

## 2020-06-01 ENCOUNTER — Encounter: Payer: Self-pay | Admitting: Physical Therapy

## 2020-06-01 NOTE — Therapy (Signed)
[facility] [address] [place], [place], [address] Phone: [phone]   Fax:  [phone]  Physical Therapy Treatment  Patient Details  Name: Kerri Simmons MRN: [id number] Date of Birth: 1926-09-05 Referring Provider (PT): caleb Jerline Pain   Encounter Date: 05/23/2020   PT End of Session - 06/01/20 1414    Visit Number 7    Number of Visits 12    Date for PT Re-Evaluation 06/01/20    Authorization Type Medicare    PT Start Time 8756    PT Stop Time 1600    PT Time Calculation (min) 45 min    Activity Tolerance Patient tolerated treatment well    Behavior During Therapy [facility] for tasks assessed/performed           Past Medical History:  Diagnosis Date  . Allergy   . Arthritis   . Cancer ([place])   . Essential hypertension 04/19/2019  . GERD (gastroesophageal reflux disease) 04/19/2019  . Glaucoma 04/19/2019  . Hyperlipidemia   . Hypothyroidism 04/19/2019  . IBS (irritable bowel syndrome) 04/19/2019  . Macular degeneration, left eye 10/21/2008  . Migraines   . Orthostasis 04/19/2019  . Pneumonia   . Thyroid disease     Past Surgical History:  Procedure Laterality Date  . ABDOMINAL HYSTERECTOMY    . FETAL BLOOD TRANSFUSION    . SQUAMOUS CELL CARCINOMA EXCISION Left    upper left arm  . THYROID LOBECTOMY      There were no vitals filed for this visit.   Subjective Assessment - 06/01/20 1407    Subjective Pt states continued soreness. Daughter states pt has not been complaining of back pain as much    Patient Stated Goals decreased pain in R shoulder and low back    Currently in Pain? Yes    Pain Location Shoulder    Pain Orientation Right    Pain Descriptors / Indicators Aching    Pain Type Acute pain    Pain Onset 1 to 4 weeks ago    Pain Frequency Intermittent    Pain Score 4    Pain Location Back    Pain Orientation Right;Left    Pain Descriptors / Indicators Aching    Pain Type Chronic pain    Pain Onset More than  a month ago    Pain Frequency Intermittent                             [facility] Adult PT Treatment/Exercise - 06/01/20 0001      Lumbar Exercises: Stretches   Active Hamstring Stretch 3 reps;30 seconds    Active Hamstring Stretch Limitations seated    Single Knee to Chest Stretch 2 reps;30 seconds;Right;Left    Figure 4 Stretch 3 reps;30 seconds      Lumbar Exercises: Standing   Other Standing Lumbar Exercises March, HIp abd and HR x 20     Other Standing Lumbar Exercises Side stepping 15 ft x 4:   fwd and bwd stepping with weight shifting (CGA) x 20 each bil;       Lumbar Exercises: Supine   Pelvic Tilt 15 reps    Bent Knee Raise 20 reps    Bridge 10 reps      Shoulder Exercises: Seated   Row 20 reps    Theraband Level (Shoulder Row) Level 2 (Red)      Manual Therapy   Manual therapy comments skilled palpation and monitoring of  soft tissue with dry needling     Soft tissue mobilization DTM to R UT and levator             Trigger Point Dry Needling - 06/01/20 0001    Consent Given? Yes    Education Handout Provided Yes    Muscles Treated Head and Neck Upper trapezius;Levator scapulae    Upper Trapezius Response Twitch reponse elicited;Palpable increased muscle length   R   Levator Scapulae Response Twitch response elicited;Palpable increased muscle length   R               PT Education - 06/01/20 1407    Education Details discussed dry needling with pt and daughter    Person(s) Educated Patient    Methods Explanation;Demonstration;Verbal cues;Handout    Comprehension Verbalized understanding;Returned demonstration;Verbal cues required            PT Short Term Goals - 04/24/20 1916      PT SHORT TERM GOAL #1   Title Pt to be independent with initial HEP    Time 2    Period Weeks    Status New    Target Date 05/04/20      PT SHORT TERM GOAL #2   Title Pt to demo ability to self correct posture, for optimal seated lumbar posture.     Time 2    Period Weeks    Status New    Target Date 05/04/20             PT Long Term Goals - 04/24/20 1917      PT LONG TERM GOAL #1   Title Pt to be independent with final HEP for shoulder and back    Time 6    Period Weeks    Status New    Target Date 06/01/20      PT LONG TERM GOAL #2   Title Pt to demo ability to self correct lumbar posture at least 75% of the time in clinic.    Time 6    Period Weeks    Status New    Target Date 06/01/20      PT LONG TERM GOAL #3   Title Pt to demo improved AROM of R shoulder to at least 120 deg for elevation, to improve ability for ADLs and IADLs.    Time 6    Period Weeks    Status New    Target Date 06/01/20      PT LONG TERM GOAL #4   Title Pt to report decreased pain in back to 0-2/10 with standing , sitting, and activity    Time 6    Period Weeks    Status New    Target Date 06/01/20      PT LONG TERM GOAL #5   Title Pt to report decreased pain in R shoulder, to 0-2/10 with activity and ADLs.    Time 6    Period Weeks    Status New    Target Date 06/01/20                 Plan - 06/01/20 1415    Clinical Impression Statement DN done for R UT and levator pain. Pt with good tolerance, will assess effects next visit. Pt also with R shoulder pain unrelated to muscle tightness, with likley RTC tear being likely cause. Plan to progress gait, balance, and pain relief for back and shoulder.    Personal Factors and Comorbidities Age;Comorbidity 1  Comorbidities R shoulder pain, Back pain, Poor balance, CA,    Examination-Activity Limitations Reach Overhead;Squat;Carry;Dressing;Lift;Locomotion Level    Examination-Participation Restrictions Meal Prep;Cleaning;Community Activity;Shop    Stability/Clinical Decision Making Evolving/Moderate complexity    Rehab Potential Good    PT Frequency 2x / week    PT Duration 6 weeks    PT Treatment/Interventions ADLs/Self Care Home Management;Cryotherapy;Electrical  Stimulation;Gait training;DME Instruction;Ultrasound;Moist Heat;Iontophoresis 4mg /ml Dexamethasone;Stair training;Traction;Functional mobility training;Therapeutic activities;Therapeutic exercise;Neuromuscular re-education;Balance training;Manual techniques;Patient/family education;Passive range of motion;Dry needling;Vasopneumatic Device;Taping;Spinal Manipulations;Joint Manipulations    PT Home Exercise Plan [email or web address]    Consulted and Agree with Plan of Care Patient;Family member/caregiver           Patient will benefit from skilled therapeutic intervention in order to improve the following deficits and impairments:  Abnormal gait, Decreased range of motion, Difficulty walking, Impaired UE functional use, Increased muscle spasms, Decreased endurance, Decreased activity tolerance, Pain, Improper body mechanics, Impaired flexibility, Hypomobility, Decreased balance, Decreased knowledge of use of DME, Decreased mobility, Decreased strength, Postural dysfunction  Visit Diagnosis: Chronic bilateral low back pain without sciatica  Acute pain of right shoulder  Other abnormalities of gait and mobility     Problem List Patient Active Problem List   Diagnosis Date Noted  . Constipation 03/14/2020  . Low back pain 03/14/2020  . Mild neurocognitive disorder 08/19/2019  . Orthostasis 04/19/2019  . Essential hypertension 04/19/2019  . Hypothyroidism 04/19/2019  . Overactive bladder 04/19/2019  . IBS (irritable bowel syndrome) 04/19/2019  . GERD (gastroesophageal reflux disease) 04/19/2019  . Dyslipidemia 04/19/2019  . Glaucoma 04/19/2019  . Macular degeneration, left eye 10/21/2008    Lyndee Hensen, PT, DPT 2:19 PM  06/01/20    [facility] [place], [place], [address] Phone: [phone]   Fax:  [phone]  Name: Maddalyn Lutze MRN: [id number] Date of Birth: June 23, 1926

## 2020-06-01 NOTE — Therapy (Signed)
[facility] [address] [place], [place], [address] Phone: [phone]   Fax:  [phone]  Physical Therapy Treatment  Patient Details  Name: Kerri Simmons MRN: [id number] Date of Birth: January 08, 1926 Referring Provider (PT): caleb Jerline Pain   Encounter Date: 05/25/2020   PT End of Session - 06/01/20 1425    Visit Number 8    Number of Visits 12    Date for PT Re-Evaluation 06/01/20    Authorization Type Medicare    PT Start Time 3300    PT Stop Time 1555    PT Time Calculation (min) 40 min    Activity Tolerance Patient tolerated treatment well    Behavior During Therapy [facility] for tasks assessed/performed           Past Medical History:  Diagnosis Date  . Allergy   . Arthritis   . Cancer ([place])   . Essential hypertension 04/19/2019  . GERD (gastroesophageal reflux disease) 04/19/2019  . Glaucoma 04/19/2019  . Hyperlipidemia   . Hypothyroidism 04/19/2019  . IBS (irritable bowel syndrome) 04/19/2019  . Macular degeneration, left eye 10/21/2008  . Migraines   . Orthostasis 04/19/2019  . Pneumonia   . Thyroid disease     Past Surgical History:  Procedure Laterality Date  . ABDOMINAL HYSTERECTOMY    . FETAL BLOOD TRANSFUSION    . SQUAMOUS CELL CARCINOMA EXCISION Left    upper left arm  . THYROID LOBECTOMY      There were no vitals filed for this visit.   Subjective Assessment - 06/01/20 1424    Subjective Pt states mild soreness in UT today, does think needling helped some. Still usine RW, but "does not want to use"    Patient Stated Goals decreased pain in R shoulder and low back                             [facility] Adult PT Treatment/Exercise - 06/01/20 1420      Lumbar Exercises: Stretches   Active Hamstring Stretch 3 reps;30 seconds    Active Hamstring Stretch Limitations seated    Single Knee to Chest Stretch 2 reps;30 seconds;Right;Left    Figure 4 Stretch 3 reps;30 seconds      Lumbar Exercises:  Standing   Other Standing Lumbar Exercises March, HIp abd and HR x 20     Other Standing Lumbar Exercises         Lumbar Exercises: Supine   Pelvic Tilt 15 reps    Clam 20 reps    Clam Limitations RTB    Bent Knee Raise 20 reps    Bridge 10 reps      Shoulder Exercises: Supine   External Rotation 15 reps    External Rotation Weight (lbs) 2    Flexion 10 reps    Flexion Limitations AROM, partial ROM      Shoulder Exercises: Seated   Row 20 reps    Theraband Level (Shoulder Row) Level 2 (Red)      Shoulder Exercises: Standing   Retraction 10 reps      Shoulder Exercises: Pulleys   Flexion 2 minutes      Manual Therapy   Passive ROM For R hip flexion, rotation, for low back stretches.     Manual Traction Long leg distraction bil for lumbar pump x 3 min;                   PT  Education - 06/01/20 1425    Education Details HEP updated    Person(s) Educated Patient    Methods Explanation;Demonstration;Tactile cues;Verbal cues;Handout    Comprehension Verbalized understanding;Returned demonstration;Verbal cues required;Tactile cues required;Need further instruction            PT Short Term Goals - 04/24/20 1916      PT SHORT TERM GOAL #1   Title Pt to be independent with initial HEP    Time 2    Period Weeks    Status New    Target Date 05/04/20      PT SHORT TERM GOAL #2   Title Pt to demo ability to self correct posture, for optimal seated lumbar posture.    Time 2    Period Weeks    Status New    Target Date 05/04/20             PT Long Term Goals - 04/24/20 1917      PT LONG TERM GOAL #1   Title Pt to be independent with final HEP for shoulder and back    Time 6    Period Weeks    Status New    Target Date 06/01/20      PT LONG TERM GOAL #2   Title Pt to demo ability to self correct lumbar posture at least 75% of the time in clinic.    Time 6    Period Weeks    Status New    Target Date 06/01/20      PT LONG TERM GOAL #3   Title Pt  to demo improved AROM of R shoulder to at least 120 deg for elevation, to improve ability for ADLs and IADLs.    Time 6    Period Weeks    Status New    Target Date 06/01/20      PT LONG TERM GOAL #4   Title Pt to report decreased pain in back to 0-2/10 with standing , sitting, and activity    Time 6    Period Weeks    Status New    Target Date 06/01/20      PT LONG TERM GOAL #5   Title Pt to report decreased pain in R shoulder, to 0-2/10 with activity and ADLs.    Time 6    Period Weeks    Status New    Target Date 06/01/20                 Plan - 06/01/20 1426    Clinical Impression Statement Pt with improving ambulation mechanics with use of RW, recommended continued use. Pt with R posterior shoulder pain with elevation and reaching. DIscused optimal mechanics for ADLs. Back pain has been variable. Plan to re-eval/re-cert next visit.    Personal Factors and Comorbidities Age;Comorbidity 1    Comorbidities R shoulder pain, Back pain, Poor balance, CA,    Examination-Activity Limitations Reach Overhead;Squat;Carry;Dressing;Lift;Locomotion Level    Examination-Participation Restrictions Meal Prep;Cleaning;Community Activity;Shop    Stability/Clinical Decision Making Evolving/Moderate complexity    Rehab Potential Good    PT Frequency 2x / week    PT Duration 6 weeks    PT Treatment/Interventions ADLs/Self Care Home Management;Cryotherapy;Electrical Stimulation;Gait training;DME Instruction;Ultrasound;Moist Heat;Iontophoresis 4mg /ml Dexamethasone;Stair training;Traction;Functional mobility training;Therapeutic activities;Therapeutic exercise;Neuromuscular re-education;Balance training;Manual techniques;Patient/family education;Passive range of motion;Dry needling;Vasopneumatic Device;Taping;Spinal Manipulations;Joint Manipulations    PT Home Exercise Plan [id number]    Consulted and Agree with Plan of Care Patient;Family member/caregiver  Patient will benefit from  skilled therapeutic intervention in order to improve the following deficits and impairments:  Abnormal gait, Decreased range of motion, Difficulty walking, Impaired UE functional use, Increased muscle spasms, Decreased endurance, Decreased activity tolerance, Pain, Improper body mechanics, Impaired flexibility, Hypomobility, Decreased balance, Decreased knowledge of use of DME, Decreased mobility, Decreased strength, Postural dysfunction  Visit Diagnosis: Chronic bilateral low back pain without sciatica  Acute pain of right shoulder  Other abnormalities of gait and mobility     Problem List Patient Active Problem List   Diagnosis Date Noted  . Constipation 03/14/2020  . Low back pain 03/14/2020  . Mild neurocognitive disorder 08/19/2019  . Orthostasis 04/19/2019  . Essential hypertension 04/19/2019  . Hypothyroidism 04/19/2019  . Overactive bladder 04/19/2019  . IBS (irritable bowel syndrome) 04/19/2019  . GERD (gastroesophageal reflux disease) 04/19/2019  . Dyslipidemia 04/19/2019  . Glaucoma 04/19/2019  . Macular degeneration, left eye 10/21/2008    Lyndee Hensen, PT, DPT 2:28 PM  06/01/20    [facility] [place], [place], [address] Phone: [phone]   Fax:  [phone]  Name: Niya Behler MRN: [id number] Date of Birth: 05-02-1926

## 2020-06-02 ENCOUNTER — Other Ambulatory Visit: Payer: Self-pay

## 2020-06-02 ENCOUNTER — Encounter: Payer: Self-pay | Admitting: Physical Therapy

## 2020-06-02 ENCOUNTER — Ambulatory Visit (INDEPENDENT_AMBULATORY_CARE_PROVIDER_SITE_OTHER): Payer: Medicare Other | Admitting: Physical Therapy

## 2020-06-02 DIAGNOSIS — G8929 Other chronic pain: Secondary | ICD-10-CM

## 2020-06-02 DIAGNOSIS — M545 Low back pain: Secondary | ICD-10-CM

## 2020-06-02 DIAGNOSIS — R2689 Other abnormalities of gait and mobility: Secondary | ICD-10-CM

## 2020-06-02 DIAGNOSIS — M25511 Pain in right shoulder: Secondary | ICD-10-CM

## 2020-06-02 NOTE — Therapy (Signed)
[facility] [address] [place], [place], [address] Phone: [phone]   Fax:  [phone]  Physical Therapy Treatment/Re-Cert      Progress note Physical Therapy Progress Note  Dates of Reporting Period:   04/20/20   to 06/02/20  Objective Reports of Subjective Statement: see below     Patient Details  Name: Kerri Simmons MRN: [id number] Date of Birth: Jun 18, 1926 Referring Provider (PT): caleb Jerline Pain   Encounter Date: 06/02/2020   PT End of Session - 06/02/20 1411    Visit Number 9    Number of Visits 20    Date for PT Re-Evaluation 07/14/20    Authorization Type Medicare    Authorization Time Period Re-cert done at visit 9    PT Start Time 1315    PT Stop Time 1400    PT Time Calculation (min) 45 min    Equipment Utilized During Treatment Gait belt    Activity Tolerance Patient tolerated treatment well    Behavior During Therapy WFL for tasks assessed/performed           Past Medical History:  Diagnosis Date  . Allergy   . Arthritis   . Cancer ([place])   . Essential hypertension 04/19/2019  . GERD (gastroesophageal reflux disease) 04/19/2019  . Glaucoma 04/19/2019  . Hyperlipidemia   . Hypothyroidism 04/19/2019  . IBS (irritable bowel syndrome) 04/19/2019  . Macular degeneration, left eye 10/21/2008  . Migraines   . Orthostasis 04/19/2019  . Pneumonia   . Thyroid disease     Past Surgical History:  Procedure Laterality Date  . ABDOMINAL HYSTERECTOMY    . FETAL BLOOD TRANSFUSION    . SQUAMOUS CELL CARCINOMA EXCISION Left    upper left arm  . THYROID LOBECTOMY      There were no vitals filed for this visit.   Subjective Assessment - 06/02/20 1409    Subjective Pt states minimal soreness in R UT. R shoulder more bothersome today than it has been.    Currently in Pain? Yes    Pain Score 5     Pain Location Shoulder    Pain Orientation Right    Pain Descriptors / Indicators Aching    Pain Type Acute pain     Pain Onset More than a month ago    Pain Frequency Intermittent    Pain Score 3    Pain Location Back    Pain Orientation Right;Left    Pain Descriptors / Indicators Aching    Pain Type Chronic pain    Pain Onset More than a month ago    Pain Frequency Intermittent              [facility] PT Assessment - 06/02/20 0001      AROM   Right Shoulder Flexion 100 Degrees      Strength   Right Shoulder Flexion 3-/5    Right Shoulder ABduction 3-/5    Right Shoulder External Rotation 3+/5                         [facility] Adult PT Treatment/Exercise - 06/02/20 0001      Ambulation/Gait   Gait Comments 35 ft x 2 with SPC(decreased speed and increased inconsistency of steps, R foot crosses over L) :  35 ft x 6 with RW, improved efficiency, speed, and step width consistency       Lumbar Exercises: Stretches   Active Hamstring Stretch 3 reps;30 seconds  Active Hamstring Stretch Limitations seated      Lumbar Exercises: Standing   Other Standing Lumbar Exercises March, HIp abd and HR x 20     Other Standing Lumbar Exercises Fwd stepping with visual cue on floor , for practice stepping more in straight fwd direction x 20 bil;       Lumbar Exercises: Supine   Clam 20 reps    Clam Limitations GTB    Bent Knee Raise 20 reps    Bridge with Ball Squeeze 15 reps    Straight Leg Raise 10 reps      Shoulder Exercises: Supine   Flexion 10 reps    Flexion Limitations AROM, partial ROM    Other Supine Exercises chest press w dowel x 15;       Shoulder Exercises: Seated   Retraction 20 reps    Other Seated Exercises IR/ER with RTB x 20 ea       Manual Therapy   Passive ROM for R shoulder, all motions                   PT Education - 06/02/20 1410    Education Details HEP reviewed    Person(s) Educated Patient    Methods Explanation;Demonstration;Verbal cues;Handout    Comprehension Verbalized understanding;Returned demonstration;Verbal cues required;Need further  instruction            PT Short Term Goals - 06/02/20 1412      PT SHORT TERM GOAL #1   Title Pt to be independent with initial HEP    Time 2    Period Weeks    Status Achieved    Target Date 05/04/20      PT SHORT TERM GOAL #2   Title Pt to demo ability to self correct posture, for optimal seated lumbar posture.    Time 2    Period Weeks    Status Achieved    Target Date 05/04/20             PT Long Term Goals - 06/02/20 1412      PT LONG TERM GOAL #1   Title Pt to be independent with final HEP for shoulder and back    Time 6    Period Weeks    Status On-going      PT LONG TERM GOAL #2   Title Pt to demo ability to self correct lumbar posture at least 75% of the time in clinic.    Time 6    Period Weeks    Status Partially Met    Target Date 07/14/20      PT LONG TERM GOAL #3   Title Pt to demo improved AROM of R shoulder to at least 120 deg for elevation, to improve ability for ADLs and IADLs.    Time 6    Period Weeks    Status On-going    Target Date 07/14/20      PT LONG TERM GOAL #4   Title Pt to report decreased pain in back to 0-2/10 with standing , sitting, and activity    Time 6    Period Weeks    Status Partially Met    Target Date 07/14/20      PT LONG TERM GOAL #5   Title Pt to report decreased pain in R shoulder, to 0-2/10 with activity and ADLs.    Time 6    Period Weeks    Status On-going    Target Date 07/14/20  Plan - 06/02/20 1422    Clinical Impression Statement Pt has been seen for 9 visits. R UT pain is improved. R shoulder continues to be bothersome. Likely RTC tear and/or significant OA, palpable griding in joint today. Recommended f/u with MD in next few weeks for possible x-ray and other pain relief options. Pt with limited elevation, pain wiht deceleration, as well as much weakness with ER. COntinued practice and education on gait safety. Pt with much improved mechanics with RW, but does not like to use  around house consistently. With [facility] today, pt with crossover of R foot over L multiple times, wiht decreased speed and stability. Stressed improved gait and need for use of RW. Pt progressing with strengthening. Pt to benefit from continuation of skilled PT to improve deficits and meet LTGs. Plan to work toward d/c as back pain, shoulder pain , and stability improve.    Personal Factors and Comorbidities Age;Comorbidity 1    Comorbidities R shoulder pain, Back pain, Poor balance, CA,    Examination-Activity Limitations Reach Overhead;Squat;Carry;Dressing;Lift;Locomotion Level    Examination-Participation Restrictions Meal Prep;Cleaning;Community Activity;Shop    Stability/Clinical Decision Making Evolving/Moderate complexity    Rehab Potential Good    PT Frequency 2x / week    PT Duration 6 weeks    PT Treatment/Interventions ADLs/Self Care Home Management;Cryotherapy;Electrical Stimulation;Gait training;DME Instruction;Ultrasound;Moist Heat;Iontophoresis 68m/ml Dexamethasone;Stair training;Traction;Functional mobility training;Therapeutic activities;Therapeutic exercise;Neuromuscular re-education;Balance training;Manual techniques;Patient/family education;Passive range of motion;Dry needling;Vasopneumatic Device;Taping;Spinal Manipulations;Joint Manipulations    PT Home Exercise Plan [id number]    Consulted and Agree with Plan of Care Patient;Family member/caregiver           Patient will benefit from skilled therapeutic intervention in order to improve the following deficits and impairments:  Abnormal gait, Decreased range of motion, Difficulty walking, Impaired UE functional use, Increased muscle spasms, Decreased endurance, Decreased activity tolerance, Pain, Improper body mechanics, Impaired flexibility, Hypomobility, Decreased balance, Decreased knowledge of use of DME, Decreased mobility, Decreased strength, Postural dysfunction  Visit Diagnosis: Chronic bilateral low back pain without  sciatica  Acute pain of right shoulder  Other abnormalities of gait and mobility     Problem List Patient Active Problem List   Diagnosis Date Noted  . Constipation 03/14/2020  . Low back pain 03/14/2020  . Mild neurocognitive disorder 08/19/2019  . Orthostasis 04/19/2019  . Essential hypertension 04/19/2019  . Hypothyroidism 04/19/2019  . Overactive bladder 04/19/2019  . IBS (irritable bowel syndrome) 04/19/2019  . GERD (gastroesophageal reflux disease) 04/19/2019  . Dyslipidemia 04/19/2019  . Glaucoma 04/19/2019  . Macular degeneration, left eye 10/21/2008    LLyndee Hensen PT, DPT 3:32 PM  06/02/20    [facility] [address]Phone: [phone]  Fax:  [phone] Name: Kerri StephensMRN: [id number] of Birth: 912/24/1927

## 2020-06-05 ENCOUNTER — Ambulatory Visit (INDEPENDENT_AMBULATORY_CARE_PROVIDER_SITE_OTHER): Payer: Medicare Other | Admitting: Physical Therapy

## 2020-06-05 ENCOUNTER — Other Ambulatory Visit: Payer: Self-pay

## 2020-06-05 DIAGNOSIS — R2689 Other abnormalities of gait and mobility: Secondary | ICD-10-CM

## 2020-06-05 DIAGNOSIS — M25511 Pain in right shoulder: Secondary | ICD-10-CM | POA: Diagnosis not present

## 2020-06-05 DIAGNOSIS — M545 Low back pain, unspecified: Secondary | ICD-10-CM

## 2020-06-05 DIAGNOSIS — G8929 Other chronic pain: Secondary | ICD-10-CM

## 2020-06-06 ENCOUNTER — Telehealth: Payer: Self-pay | Admitting: Family Medicine

## 2020-06-06 NOTE — Telephone Encounter (Signed)
Appt. Scheduled.

## 2020-06-06 NOTE — Telephone Encounter (Signed)
Daughter called stating that patient has appt end of month.  Daughter would like for patient to be seen next week if possible.  States that patient has lost 10 pounds and that Lauren wanted an x ray ordered on patients shoulder.  Please advise.

## 2020-06-07 ENCOUNTER — Encounter: Payer: Self-pay | Admitting: Physical Therapy

## 2020-06-07 NOTE — Therapy (Signed)
[facility] [address] [place], [place], [address] Phone: [phone]   Fax:  [phone]  Physical Therapy Treatment  Patient Details  Name: Kerri Simmons MRN: [id number] Date of Birth: 1926/06/11 Referring Provider (PT): caleb Jerline Pain   Encounter Date: 06/05/2020   PT End of Session - 06/07/20 1442    Visit Number 10    Number of Visits 20    Date for PT Re-Evaluation 07/14/20    Authorization Type Medicare    Authorization Time Period Re-cert done at visit 9    PT Start Time 1600    PT Stop Time 1642    PT Time Calculation (min) 42 min    Equipment Utilized During Treatment Gait belt    Activity Tolerance Patient tolerated treatment well    Behavior During Therapy WFL for tasks assessed/performed           Past Medical History:  Diagnosis Date  . Allergy   . Arthritis   . Cancer ([place])   . Essential hypertension 04/19/2019  . GERD (gastroesophageal reflux disease) 04/19/2019  . Glaucoma 04/19/2019  . Hyperlipidemia   . Hypothyroidism 04/19/2019  . IBS (irritable bowel syndrome) 04/19/2019  . Macular degeneration, left eye 10/21/2008  . Migraines   . Orthostasis 04/19/2019  . Pneumonia   . Thyroid disease     Past Surgical History:  Procedure Laterality Date  . ABDOMINAL HYSTERECTOMY    . FETAL BLOOD TRANSFUSION    . SQUAMOUS CELL CARCINOMA EXCISION Left    upper left arm  . THYROID LOBECTOMY      There were no vitals filed for this visit.   Subjective Assessment - 06/07/20 1442    Subjective Pt states back has not been too bad, shoulder continues to be very sore.    Patient Stated Goals decreased pain in R shoulder and low back    Currently in Pain? Yes    Pain Location Shoulder    Pain Orientation Right    Pain Descriptors / Indicators Aching    Pain Type Acute pain    Pain Onset More than a month ago    Pain Frequency Intermittent    Pain Score 3    Pain Location Back    Pain Orientation Right;Left     Pain Descriptors / Indicators Aching    Pain Type Chronic pain    Pain Onset More than a month ago    Pain Frequency Intermittent                             [facility] Adult PT Treatment/Exercise - 06/07/20 0001      Lumbar Exercises: Stretches   Active Hamstring Stretch 3 reps;30 seconds    Active Hamstring Stretch Limitations manual      Lumbar Exercises: Supine   Clam 20 reps    Clam Limitations GTB    Bridge 15 reps      Shoulder Exercises: Supine   External Rotation 15 reps    External Rotation Weight (lbs) 2      Shoulder Exercises: Seated   Retraction 20 reps    External Rotation 15 reps    Theraband Level (Shoulder External Rotation) Level 2 (Red)    Internal Rotation 15 reps    Theraband Level (Shoulder Internal Rotation) Level 2 (Red)      Manual Therapy   Manual therapy comments skilled palpation and monitoring of soft tissue with dry needling  Soft tissue mobilization DTM to R UT and levator     Passive ROM for R shoulder, all motions     Manual Traction Long leg distraction bil for lumbar pump x 3 min;             Trigger Point Dry Needling - 06/07/20 0001    Consent Given? Yes    Education Handout Provided Previously provided    Muscles Treated Head and Neck Upper trapezius    Upper Trapezius Response Twitch reponse elicited;Palpable increased muscle length   R                 PT Short Term Goals - 06/02/20 1412      PT SHORT TERM GOAL #1   Title Pt to be independent with initial HEP    Time 2    Period Weeks    Status Achieved    Target Date 05/04/20      PT SHORT TERM GOAL #2   Title Pt to demo ability to self correct posture, for optimal seated lumbar posture.    Time 2    Period Weeks    Status Achieved    Target Date 05/04/20             PT Long Term Goals - 06/02/20 1412      PT LONG TERM GOAL #1   Title Pt to be independent with final HEP for shoulder and back    Time 6    Period Weeks    Status  On-going      PT LONG TERM GOAL #2   Title Pt to demo ability to self correct lumbar posture at least 75% of the time in clinic.    Time 6    Period Weeks    Status Partially Met    Target Date 07/14/20      PT LONG TERM GOAL #3   Title Pt to demo improved AROM of R shoulder to at least 120 deg for elevation, to improve ability for ADLs and IADLs.    Time 6    Period Weeks    Status On-going    Target Date 07/14/20      PT LONG TERM GOAL #4   Title Pt to report decreased pain in back to 0-2/10 with standing , sitting, and activity    Time 6    Period Weeks    Status Partially Met    Target Date 07/14/20      PT LONG TERM GOAL #5   Title Pt to report decreased pain in R shoulder, to 0-2/10 with activity and ADLs.    Time 6    Period Weeks    Status On-going    Target Date 07/14/20                 Plan - 06/07/20 1444    Clinical Impression Statement Pt with continued shoulder pain. Recommended f/u with MD for further options for pain relief. Likely rtc tear and/or advanced OA. Pt with painful trigger point in UT today, did have good results from Dry needling in past visit, so needling repeated today. Plan to continue progression for strength, gait, and balance.    Personal Factors and Comorbidities Age;Comorbidity 1    Comorbidities R shoulder pain, Back pain, Poor balance, CA,    Examination-Activity Limitations Reach Overhead;Squat;Carry;Dressing;Lift;Locomotion Level    Examination-Participation Restrictions Meal Prep;Cleaning;Community Activity;Shop    Stability/Clinical Decision Making Evolving/Moderate complexity    Rehab Potential Good  PT Frequency 2x / week    PT Duration 6 weeks    PT Treatment/Interventions ADLs/Self Care Home Management;Cryotherapy;Electrical Stimulation;Gait training;DME Instruction;Ultrasound;Moist Heat;Iontophoresis 81m/ml Dexamethasone;Stair training;Traction;Functional mobility training;Therapeutic activities;Therapeutic  exercise;Neuromuscular re-education;Balance training;Manual techniques;Patient/family education;Passive range of motion;Dry needling;Vasopneumatic Device;Taping;Spinal Manipulations;Joint Manipulations    PT Home Exercise Plan [id number]    Consulted and Agree with Plan of Care Patient;Family member/caregiver           Patient will benefit from skilled therapeutic intervention in order to improve the following deficits and impairments:  Abnormal gait, Decreased range of motion, Difficulty walking, Impaired UE functional use, Increased muscle spasms, Decreased endurance, Decreased activity tolerance, Pain, Improper body mechanics, Impaired flexibility, Hypomobility, Decreased balance, Decreased knowledge of use of DME, Decreased mobility, Decreased strength, Postural dysfunction  Visit Diagnosis: Chronic bilateral low back pain without sciatica  Acute pain of right shoulder  Other abnormalities of gait and mobility     Problem List Patient Active Problem List   Diagnosis Date Noted  . Constipation 03/14/2020  . Low back pain 03/14/2020  . Mild neurocognitive disorder 08/19/2019  . Orthostasis 04/19/2019  . Essential hypertension 04/19/2019  . Hypothyroidism 04/19/2019  . Overactive bladder 04/19/2019  . IBS (irritable bowel syndrome) 04/19/2019  . GERD (gastroesophageal reflux disease) 04/19/2019  . Dyslipidemia 04/19/2019  . Glaucoma 04/19/2019  . Macular degeneration, left eye 10/21/2008    LLyndee Hensen PT, DPT 2:51 PM  06/07/20    [facility] [address]Phone: [phone]  Fax:  [phone] Name: Kerri SevertsonMRN: [id number] of Birth: [birth year]

## 2020-06-08 ENCOUNTER — Ambulatory Visit (INDEPENDENT_AMBULATORY_CARE_PROVIDER_SITE_OTHER): Payer: Medicare Other | Admitting: Physical Therapy

## 2020-06-08 ENCOUNTER — Other Ambulatory Visit: Payer: Self-pay

## 2020-06-08 DIAGNOSIS — M25511 Pain in right shoulder: Secondary | ICD-10-CM | POA: Diagnosis not present

## 2020-06-08 DIAGNOSIS — M545 Low back pain: Secondary | ICD-10-CM

## 2020-06-08 DIAGNOSIS — G8929 Other chronic pain: Secondary | ICD-10-CM

## 2020-06-08 DIAGNOSIS — R2689 Other abnormalities of gait and mobility: Secondary | ICD-10-CM

## 2020-06-09 ENCOUNTER — Ambulatory Visit (INDEPENDENT_AMBULATORY_CARE_PROVIDER_SITE_OTHER)
Admission: RE | Admit: 2020-06-09 | Discharge: 2020-06-09 | Disposition: A | Discharge status: Home / Self Care | Payer: Medicare Other | Source: Ambulatory Visit | Attending: Family Medicine | Admitting: Family Medicine

## 2020-06-09 ENCOUNTER — Encounter: Payer: Self-pay | Admitting: Family Medicine

## 2020-06-09 ENCOUNTER — Ambulatory Visit (INDEPENDENT_AMBULATORY_CARE_PROVIDER_SITE_OTHER): Payer: Medicare Other | Admitting: Family Medicine

## 2020-06-09 ENCOUNTER — Encounter: Payer: Self-pay | Admitting: Physical Therapy

## 2020-06-09 VITALS — BP 147/58 | HR 57 | Temp 97.9°F | Ht 61.5 in | Wt 108.6 lb

## 2020-06-09 DIAGNOSIS — M25511 Pain in right shoulder: Secondary | ICD-10-CM | POA: Diagnosis not present

## 2020-06-09 DIAGNOSIS — R634 Abnormal weight loss: Secondary | ICD-10-CM | POA: Diagnosis not present

## 2020-06-09 DIAGNOSIS — E039 Hypothyroidism, unspecified: Secondary | ICD-10-CM

## 2020-06-09 DIAGNOSIS — Z79899 Other long term (current) drug therapy: Secondary | ICD-10-CM | POA: Diagnosis not present

## 2020-06-09 DIAGNOSIS — K219 Gastro-esophageal reflux disease without esophagitis: Secondary | ICD-10-CM

## 2020-06-09 NOTE — Progress Notes (Signed)
   Kerri Simmons is a 86 y.o. female who presents today for an office visit.  Assessment/Plan:  New/Acute Problems: Weight Loss No clear etiology.  Check UA, CBC, CMET, TSH, B12.  Discussed importance of caloric intake.  Recommended supplementing with boost or Ensure.  We will follow-up in 4 to 6 weeks.  Right Shoulder Pain Check x-ray.  Has been working with physical therapy.  May need referral to sports medicine.  Chronic Problems Addressed Today: Hypothyroidism Continue Synthroid 50 mcg daily.  Will check TSH.  GERD (gastroesophageal reflux disease) Continue Nexium 40 mg twice daily.  If continues to lose weight and above labs normal would consider referral to GI.     Subjective:  HPI:  Patient here with concerns for weight loss.  She is here with her daughter.  Daughter is concerned that she has low appetite and is eating as much as she should.  She is also had a little fatigue lately.  Patient has lost about 9 pounds in the last 2 months.  Patient thinks her appetite is normal.  She feels like she is eating appropriately.  She has foods that she enjoys.  No abdominal pain with feeding.  No mouth pain with feeding.  No nausea or vomiting.       Objective:  Physical Exam: BP (!) 147/58   Pulse (!) 57   Temp 97.9 F (36.6 C) (Temporal)   Ht 5' 1.5" (1.562 m)   Wt 108 lb 9.6 oz (49.3 kg)   SpO2 100%   BMI 20.19 kg/m   Wt Readings from Last 3 Encounters:  06/09/20 108 lb 9.6 oz (49.3 kg)  03/23/20 117 lb 3.2 oz (53.2 kg)  03/14/20 112 lb 9.6 oz (51.1 kg)    Gen: No acute distress, resting comfortably CV: Regular rate and rhythm with no murmurs appreciated Pulm: Normal work of breathing, clear to auscultation bilaterally with no crackles, wheezes, or rhonchi Neuro: Grossly normal, moves all extremities Psych: Normal affect and thought content      Kerri Simmons M. Jerline Pain, MD 06/09/2020 12:29 PM

## 2020-06-09 NOTE — Assessment & Plan Note (Signed)
Continue Nexium 40 mg twice daily.  If continues to lose weight and above labs normal would consider referral to GI.

## 2020-06-09 NOTE — Assessment & Plan Note (Signed)
Continue Synthroid 50 mcg daily.  Will check TSH.

## 2020-06-09 NOTE — Patient Instructions (Signed)
It was very nice to see you today!  Please go to the [facility] office to get your x-ray on your shoulder joint.  Address: [address], [place], [place] [address]  You have no dietary restrictions.  You can eat whatever you want.  Please try taking a boost or Ensure supplement once daily.  We will check blood work and a urine sample today to make sure there is nothing else that could explain your weight loss.  I will see you back in 4 to 6 weeks.  Please come back to me sooner if needed.  Take care, Dr Jerline Pain  Please try these tips to maintain a healthy lifestyle:   Eat at least 3 REAL meals and 1-2 snacks per day.  Aim for no more than 5 hours between eating.  If you eat breakfast, please do so within one hour of getting up.    Each meal should contain half fruits/vegetables, one quarter protein, and one quarter carbs (no bigger than a computer mouse)   Cut down on sweet beverages. This includes juice, soda, and sweet tea.     Drink at least 1 glass of water with each meal and aim for at least 8 glasses per day   Exercise at least 150 minutes every week.

## 2020-06-09 NOTE — Therapy (Signed)
[facility] [address] [place], [place], [address] Phone: [phone]   Fax:  [phone]  Physical Therapy Treatment  Patient Details  Name: Kerri Simmons MRN: [id number] Date of Birth: Sep 20, 1926 Referring Provider (PT): caleb Jerline Pain   Encounter Date: 06/08/2020   PT End of Session - 06/09/20 2154    Visit Number 11    Number of Visits 20    Date for PT Re-Evaluation 07/14/20    Authorization Type Medicare    Authorization Time Period Re-cert done at visit 9    PT Start Time 1517    PT Stop Time 1600    PT Time Calculation (min) 43 min    Equipment Utilized During Treatment Gait belt    Activity Tolerance Patient tolerated treatment well    Behavior During Therapy [facility] for tasks assessed/performed           Past Medical History:  Diagnosis Date  . Allergy   . Arthritis   . Cancer ([place])   . Essential hypertension 04/19/2019  . GERD (gastroesophageal reflux disease) 04/19/2019  . Glaucoma 04/19/2019  . Hyperlipidemia   . Hypothyroidism 04/19/2019  . IBS (irritable bowel syndrome) 04/19/2019  . Macular degeneration, left eye 10/21/2008  . Migraines   . Orthostasis 04/19/2019  . Pneumonia   . Thyroid disease     Past Surgical History:  Procedure Laterality Date  . ABDOMINAL HYSTERECTOMY    . FETAL BLOOD TRANSFUSION    . SQUAMOUS CELL CARCINOMA EXCISION Left    upper left arm  . THYROID LOBECTOMY      There were no vitals filed for this visit.   Subjective Assessment - 06/09/20 2153    Subjective Pt reports some improvments of UT pain. R shoulder still very sore. Daughter states pt is forgetting to use RW in the house, and finds her walking around with no AD much of the time.    Currently in Pain? Yes    Pain Score 5     Pain Location Shoulder    Pain Orientation Right    Pain Descriptors / Indicators Aching    Pain Type Acute pain    Pain Onset More than a month ago    Pain Frequency Intermittent    Pain Score  4    Pain Location Back    Pain Orientation Right    Pain Descriptors / Indicators Aching    Pain Type Chronic pain    Pain Onset More than a month ago    Pain Frequency Intermittent                             [facility] Adult PT Treatment/Exercise - 06/09/20 0001      Ambulation/Gait   Gait Comments 35 ft x 4 with RW,  x4 with no AD.       Lumbar Exercises: Stretches   Active Hamstring Stretch 3 reps;30 seconds    Active Hamstring Stretch Limitations seated    Single Knee to Chest Stretch 2 reps;30 seconds;Right;Left      Lumbar Exercises: Standing   Other Standing Lumbar Exercises March, HIp abd and HR x 20     Other Standing Lumbar Exercises L/R weight shifts with stepping,  Staggered stance weight shifts x 20 bil;, no UE support.       Lumbar Exercises: Supine   Clam 20 reps    Clam Limitations GTB      Shoulder Exercises: Supine  External Rotation 15 reps    External Rotation Weight (lbs) 2      Shoulder Exercises: Seated   Retraction 20 reps    External Rotation 15 reps    Theraband Level (Shoulder External Rotation) Level 2 (Red)    Internal Rotation 15 reps    Theraband Level (Shoulder Internal Rotation) Level 2 (Red)    Flexion AROM;15 reps      Manual Therapy   Passive ROM for R shoulder, all motions     Manual Traction Long leg distraction bil for lumbar pump x 3 min;                     PT Short Term Goals - 06/02/20 1412      PT SHORT TERM GOAL #1   Title Pt to be independent with initial HEP    Time 2    Period Weeks    Status Achieved    Target Date 05/04/20      PT SHORT TERM GOAL #2   Title Pt to demo ability to self correct posture, for optimal seated lumbar posture.    Time 2    Period Weeks    Status Achieved    Target Date 05/04/20             PT Long Term Goals - 06/02/20 1412      PT LONG TERM GOAL #1   Title Pt to be independent with final HEP for shoulder and back    Time 6    Period Weeks     Status On-going      PT LONG TERM GOAL #2   Title Pt to demo ability to self correct lumbar posture at least 75% of the time in clinic.    Time 6    Period Weeks    Status Partially Met    Target Date 07/14/20      PT LONG TERM GOAL #3   Title Pt to demo improved AROM of R shoulder to at least 120 deg for elevation, to improve ability for ADLs and IADLs.    Time 6    Period Weeks    Status On-going    Target Date 07/14/20      PT LONG TERM GOAL #4   Title Pt to report decreased pain in back to 0-2/10 with standing , sitting, and activity    Time 6    Period Weeks    Status Partially Met    Target Date 07/14/20      PT LONG TERM GOAL #5   Title Pt to report decreased pain in R shoulder, to 0-2/10 with activity and ADLs.    Time 6    Period Weeks    Status On-going    Target Date 07/14/20                 Plan - 06/09/20 2157    Clinical Impression Statement Pt with f/u with MD for shoulder pain tomorrow. Pt with decreased balance and efficiency with no AD. Have continued discussions with pt and daughter about using RW, but pt forgetting to use at home. Pt with improving back pain and improved stability with dynamic balance .    Personal Factors and Comorbidities Age;Comorbidity 1    Comorbidities R shoulder pain, Back pain, Poor balance, CA,    Examination-Activity Limitations Reach Overhead;Squat;Carry;Dressing;Lift;Locomotion Level    Examination-Participation Restrictions Meal Prep;Cleaning;Community Activity;Shop    Stability/Clinical Decision Making Evolving/Moderate complexity  Rehab Potential Good    PT Frequency 2x / week    PT Duration 6 weeks    PT Treatment/Interventions ADLs/Self Care Home Management;Cryotherapy;Electrical Stimulation;Gait training;DME Instruction;Ultrasound;Moist Heat;Iontophoresis 64m/ml Dexamethasone;Stair training;Traction;Functional mobility training;Therapeutic activities;Therapeutic exercise;Neuromuscular re-education;Balance  training;Manual techniques;Patient/family education;Passive range of motion;Dry needling;Vasopneumatic Device;Taping;Spinal Manipulations;Joint Manipulations    PT Home Exercise Plan [id number]    Consulted and Agree with Plan of Care Patient;Family member/caregiver           Patient will benefit from skilled therapeutic intervention in order to improve the following deficits and impairments:  Abnormal gait, Decreased range of motion, Difficulty walking, Impaired UE functional use, Increased muscle spasms, Decreased endurance, Decreased activity tolerance, Pain, Improper body mechanics, Impaired flexibility, Hypomobility, Decreased balance, Decreased knowledge of use of DME, Decreased mobility, Decreased strength, Postural dysfunction  Visit Diagnosis: Chronic bilateral low back pain without sciatica  Acute pain of right shoulder  Other abnormalities of gait and mobility     Problem List Patient Active Problem List   Diagnosis Date Noted  . Constipation 03/14/2020  . Low back pain 03/14/2020  . Mild neurocognitive disorder 08/19/2019  . Orthostasis 04/19/2019  . Essential hypertension 04/19/2019  . Hypothyroidism 04/19/2019  . Overactive bladder 04/19/2019  . IBS (irritable bowel syndrome) 04/19/2019  . GERD (gastroesophageal reflux disease) 04/19/2019  . Dyslipidemia 04/19/2019  . Glaucoma 04/19/2019  . Macular degeneration, left eye 10/21/2008    LLyndee Hensen PT, DPT 10:00 PM  06/09/20    [place] [address]Phone: [phone]  Fax:  [phone] Name: MKamber VignolaMRN: [id number] of Birth: 9Mar 23, 1927

## 2020-06-10 LAB — URINALYSIS, ROUTINE W REFLEX MICROSCOPIC
Bilirubin Urine: NEGATIVE
Glucose, UA: NEGATIVE
Hgb urine dipstick: NEGATIVE
Ketones, ur: NEGATIVE
Leukocytes,Ua: NEGATIVE
Nitrite: NEGATIVE
Protein, ur: NEGATIVE
Specific Gravity, Urine: 1.009 (ref 1.001–1.03)
pH: 6.5 (ref 5.0–8.0)

## 2020-06-10 LAB — CBC
HCT: 33.8 % — ABNORMAL LOW (ref 35.0–45.0)
Hemoglobin: 11.1 g/dL — ABNORMAL LOW (ref 11.7–15.5)
MCH: 32 pg (ref 27.0–33.0)
MCHC: 32.8 g/dL (ref 32.0–36.0)
MCV: 97.4 fL (ref 80.0–100.0)
MPV: 13.3 fL — ABNORMAL HIGH (ref 7.5–12.5)
Platelets: 155 10*3/uL (ref 140–400)
RBC: 3.47 10*6/uL — ABNORMAL LOW (ref 3.80–5.10)
RDW: 13.5 % (ref 11.0–15.0)
WBC: 3.5 10*3/uL — ABNORMAL LOW (ref 3.8–10.8)

## 2020-06-10 LAB — COMPREHENSIVE METABOLIC PANEL
AG Ratio: 2.6 (calc) — ABNORMAL HIGH (ref 1.0–2.5)
ALT: 10 U/L (ref 6–29)
AST: 15 U/L (ref 10–35)
Albumin: 4.1 g/dL (ref 3.6–5.1)
Alkaline phosphatase (APISO): 64 U/L (ref 37–153)
BUN: 17 mg/dL (ref 7–25)
CO2: 26 mmol/L (ref 20–32)
Calcium: 9 mg/dL (ref 8.6–10.4)
Chloride: 102 mmol/L (ref 98–110)
Creat: 0.85 mg/dL (ref 0.60–0.88)
Globulin: 1.6 g/dL (calc) — ABNORMAL LOW (ref 1.9–3.7)
Glucose, Bld: 102 mg/dL — ABNORMAL HIGH (ref 65–99)
Potassium: 4.5 mmol/L (ref 3.5–5.3)
Sodium: 137 mmol/L (ref 135–146)
Total Bilirubin: 0.5 mg/dL (ref 0.2–1.2)
Total Protein: 5.7 g/dL — ABNORMAL LOW (ref 6.1–8.1)

## 2020-06-10 LAB — TSH: TSH: 1.4 mIU/L (ref 0.40–4.50)

## 2020-06-10 LAB — VITAMIN B12: Vitamin B-12: 382 pg/mL (ref 200–1100)

## 2020-06-12 ENCOUNTER — Ambulatory Visit: Payer: Medicare Other | Admitting: Family Medicine

## 2020-06-12 NOTE — Progress Notes (Signed)
Please inform patient of the following:  Xray shows arthritis. Would like fo rher to follow up with sports medicine if her shoulder is till bothering her.  Labs show she is low in protein but everything else is stable. Would like for her to make the dietary changes we discussed and I will see her back in a few weeks.  Kerri Simmons. Jerline Pain, MD 06/12/2020 10:16 AM

## 2020-06-15 ENCOUNTER — Other Ambulatory Visit: Payer: Self-pay

## 2020-06-15 ENCOUNTER — Ambulatory Visit (INDEPENDENT_AMBULATORY_CARE_PROVIDER_SITE_OTHER): Payer: Medicare Other | Admitting: Physical Therapy

## 2020-06-15 DIAGNOSIS — M25511 Pain in right shoulder: Secondary | ICD-10-CM

## 2020-06-15 DIAGNOSIS — R2689 Other abnormalities of gait and mobility: Secondary | ICD-10-CM | POA: Diagnosis not present

## 2020-06-15 DIAGNOSIS — G8929 Other chronic pain: Secondary | ICD-10-CM

## 2020-06-15 DIAGNOSIS — M545 Low back pain, unspecified: Secondary | ICD-10-CM

## 2020-06-16 ENCOUNTER — Encounter: Payer: Self-pay | Admitting: Physical Therapy

## 2020-06-16 NOTE — Therapy (Signed)
[facility] [address] [place], [place], [address] Phone: [phone]   Fax:  [phone]  Physical Therapy Treatment  Patient Details  Name: Kerri Simmons MRN: [id number] Date of Birth: 04-Jan-1926 Referring Provider (PT): caleb Jerline Pain   Encounter Date: 06/15/2020   PT End of Session - 06/16/20 2121    Visit Number 12    Number of Visits 20    Date for PT Re-Evaluation 07/14/20    Authorization Type Medicare    Authorization Time Period Re-cert done at visit 9    PT Start Time 1605    PT Stop Time 1643    PT Time Calculation (min) 38 min    Equipment Utilized During Treatment Gait belt    Activity Tolerance Patient tolerated treatment well    Behavior During Therapy [facility] for tasks assessed/performed           Past Medical History:  Diagnosis Date  . Allergy   . Arthritis   . Cancer ([place])   . Essential hypertension 04/19/2019  . GERD (gastroesophageal reflux disease) 04/19/2019  . Glaucoma 04/19/2019  . Hyperlipidemia   . Hypothyroidism 04/19/2019  . IBS (irritable bowel syndrome) 04/19/2019  . Macular degeneration, left eye 10/21/2008  . Migraines   . Orthostasis 04/19/2019  . Pneumonia   . Thyroid disease     Past Surgical History:  Procedure Laterality Date  . ABDOMINAL HYSTERECTOMY    . FETAL BLOOD TRANSFUSION    . SQUAMOUS CELL CARCINOMA EXCISION Left    upper left arm  . THYROID LOBECTOMY      There were no vitals filed for this visit.   Subjective Assessment - 06/16/20 2120    Subjective Pt reports some improvements in UT pain. Shoulder still very sore. States back has not been to bad.    Currently in Pain? Yes    Pain Score 5     Pain Location Shoulder    Pain Orientation Right    Pain Descriptors / Indicators Aching    Pain Type Acute pain    Pain Onset More than a month ago    Pain Frequency Intermittent    Pain Score 4    Pain Location Back    Pain Orientation Right    Pain Descriptors /  Indicators Aching    Pain Type Chronic pain    Pain Onset More than a month ago    Pain Frequency Intermittent                             [facility] Adult PT Treatment/Exercise - 06/16/20 0001      Ambulation/Gait   Gait Comments 35 ft x 7 with RW.       Lumbar Exercises: Stretches   Single Knee to Chest Stretch 2 reps;30 seconds;Right;Left      Lumbar Exercises: Seated   Sit to Stand 10 reps    Sit to Stand Limitations with education on form and mechanics       Lumbar Exercises: Supine   Clam 20 reps    Clam Limitations GTB      Shoulder Exercises: Seated   Retraction 10 reps    Row 20 reps    Theraband Level (Shoulder Row) Level 2 (Red)    External Rotation 15 reps    Theraband Level (Shoulder External Rotation) Level 2 (Red)    Internal Rotation 15 reps    Theraband Level (Shoulder Internal Rotation) Level 2 (  Red)      Manual Therapy   Passive ROM for R shoulder, all motions                   PT Education - 06/16/20 2121    Education Details HEP reviewed    Person(s) Educated Patient    Methods Explanation;Demonstration;Verbal cues    Comprehension Verbalized understanding;Returned demonstration;Verbal cues required            PT Short Term Goals - 06/02/20 1412      PT SHORT TERM GOAL #1   Title Pt to be independent with initial HEP    Time 2    Period Weeks    Status Achieved    Target Date 05/04/20      PT SHORT TERM GOAL #2   Title Pt to demo ability to self correct posture, for optimal seated lumbar posture.    Time 2    Period Weeks    Status Achieved    Target Date 05/04/20             PT Long Term Goals - 06/02/20 1412      PT LONG TERM GOAL #1   Title Pt to be independent with final HEP for shoulder and back    Time 6    Period Weeks    Status On-going      PT LONG TERM GOAL #2   Title Pt to demo ability to self correct lumbar posture at least 75% of the time in clinic.    Time 6    Period Weeks     Status Partially Met    Target Date 07/14/20      PT LONG TERM GOAL #3   Title Pt to demo improved AROM of R shoulder to at least 120 deg for elevation, to improve ability for ADLs and IADLs.    Time 6    Period Weeks    Status On-going    Target Date 07/14/20      PT LONG TERM GOAL #4   Title Pt to report decreased pain in back to 0-2/10 with standing , sitting, and activity    Time 6    Period Weeks    Status Partially Met    Target Date 07/14/20      PT LONG TERM GOAL #5   Title Pt to report decreased pain in R shoulder, to 0-2/10 with activity and ADLs.    Time 6    Period Weeks    Status On-going    Target Date 07/14/20                 Plan - 06/16/20 2123    Clinical Impression Statement Pt continues to have bothersome shoulder pain, will discuss f/u with MD. Pt doing well with ambulation, when using RW, but does not always remember to use it. Seems to be having less back pain, and doing well with ther ex. Plan to discuss d/c with pt and daugher next week, likley d/c next week.    Personal Factors and Comorbidities Age;Comorbidity 1    Comorbidities R shoulder pain, Back pain, Poor balance, CA,    Examination-Activity Limitations Reach Overhead;Squat;Carry;Dressing;Lift;Locomotion Level    Examination-Participation Restrictions Meal Prep;Cleaning;Community Activity;Shop    Stability/Clinical Decision Making Evolving/Moderate complexity    Rehab Potential Good    PT Frequency 2x / week    PT Duration 6 weeks    PT Treatment/Interventions ADLs/Self Care Home Management;Cryotherapy;Electrical Stimulation;Gait training;DME Instruction;Ultrasound;Moist Heat;Iontophoresis 4mg/ml   Dexamethasone;Stair training;Traction;Functional mobility training;Therapeutic activities;Therapeutic exercise;Neuromuscular re-education;Balance training;Manual techniques;Patient/family education;Passive range of motion;Dry needling;Vasopneumatic Device;Taping;Spinal Manipulations;Joint  Manipulations    PT Home Exercise Plan [id number]    Consulted and Agree with Plan of Care Patient;Family member/caregiver           Patient will benefit from skilled therapeutic intervention in order to improve the following deficits and impairments:  Abnormal gait, Decreased range of motion, Difficulty walking, Impaired UE functional use, Increased muscle spasms, Decreased endurance, Decreased activity tolerance, Pain, Improper body mechanics, Impaired flexibility, Hypomobility, Decreased balance, Decreased knowledge of use of DME, Decreased mobility, Decreased strength, Postural dysfunction  Visit Diagnosis: Acute pain of right shoulder  Chronic bilateral low back pain without sciatica  Other abnormalities of gait and mobility     Problem List Patient Active Problem List   Diagnosis Date Noted  . Constipation 03/14/2020  . Low back pain 03/14/2020  . Mild neurocognitive disorder 08/19/2019  . Orthostasis 04/19/2019  . Essential hypertension 04/19/2019  . Hypothyroidism 04/19/2019  . Overactive bladder 04/19/2019  . IBS (irritable bowel syndrome) 04/19/2019  . GERD (gastroesophageal reflux disease) 04/19/2019  . Dyslipidemia 04/19/2019  . Glaucoma 04/19/2019  . Macular degeneration, left eye 10/21/2008     , PT, DPT 9:24 PM  06/16/20    [facility] [address] [place], [place], [address] Phone: [phone]   Fax:  [phone]  Name: Kerri Simmons MRN: [id number] Date of Birth: 04/30/1926   

## 2020-06-19 ENCOUNTER — Ambulatory Visit: Payer: Medicare Other | Admitting: Family Medicine

## 2020-06-19 ENCOUNTER — Ambulatory Visit (INDEPENDENT_AMBULATORY_CARE_PROVIDER_SITE_OTHER): Payer: Medicare Other | Admitting: Physical Therapy

## 2020-06-19 ENCOUNTER — Other Ambulatory Visit: Payer: Self-pay

## 2020-06-19 DIAGNOSIS — G8929 Other chronic pain: Secondary | ICD-10-CM | POA: Diagnosis not present

## 2020-06-19 DIAGNOSIS — R2689 Other abnormalities of gait and mobility: Secondary | ICD-10-CM | POA: Diagnosis not present

## 2020-06-19 DIAGNOSIS — M545 Low back pain, unspecified: Secondary | ICD-10-CM

## 2020-06-19 DIAGNOSIS — M25511 Pain in right shoulder: Secondary | ICD-10-CM

## 2020-06-21 ENCOUNTER — Other Ambulatory Visit: Payer: Self-pay

## 2020-06-21 ENCOUNTER — Encounter: Payer: Self-pay | Admitting: Physical Therapy

## 2020-06-21 ENCOUNTER — Ambulatory Visit (INDEPENDENT_AMBULATORY_CARE_PROVIDER_SITE_OTHER): Payer: Medicare Other | Admitting: Physical Therapy

## 2020-06-21 DIAGNOSIS — M25511 Pain in right shoulder: Secondary | ICD-10-CM

## 2020-06-21 DIAGNOSIS — R2689 Other abnormalities of gait and mobility: Secondary | ICD-10-CM

## 2020-06-21 DIAGNOSIS — G8929 Other chronic pain: Secondary | ICD-10-CM | POA: Diagnosis not present

## 2020-06-21 DIAGNOSIS — M545 Low back pain: Secondary | ICD-10-CM

## 2020-06-21 NOTE — Therapy (Signed)
[facility] [address] [place], [place], [address] Phone: [phone]   Fax:  [phone]  Physical Therapy Treatment  Patient Details  Name: Kerri Simmons MRN: [id number] Date of Birth: 01-20-1926 Referring Provider (PT): caleb Jerline Pain   Encounter Date: 06/19/2020   PT End of Session - 06/21/20 1249    Visit Number 13    Number of Visits 20    Date for PT Re-Evaluation 07/14/20    Authorization Type Medicare    Authorization Time Period Re-cert done at visit 9    PT Start Time 1605    PT Stop Time 1644    PT Time Calculation (min) 39 min    Equipment Utilized During Treatment Gait belt    Activity Tolerance Patient tolerated treatment well    Behavior During Therapy WFL for tasks assessed/performed           Past Medical History:  Diagnosis Date  . Allergy   . Arthritis   . Cancer ([place])   . Essential hypertension 04/19/2019  . GERD (gastroesophageal reflux disease) 04/19/2019  . Glaucoma 04/19/2019  . Hyperlipidemia   . Hypothyroidism 04/19/2019  . IBS (irritable bowel syndrome) 04/19/2019  . Macular degeneration, left eye 10/21/2008  . Migraines   . Orthostasis 04/19/2019  . Pneumonia   . Thyroid disease     Past Surgical History:  Procedure Laterality Date  . ABDOMINAL HYSTERECTOMY    . FETAL BLOOD TRANSFUSION    . SQUAMOUS CELL CARCINOMA EXCISION Left    upper left arm  . THYROID LOBECTOMY      There were no vitals filed for this visit.   Subjective Assessment - 06/21/20 1249    Subjective Pt states shoulder and back sore today    Patient Stated Goals decreased pain in R shoulder and low back    Currently in Pain? Yes    Pain Score 5     Pain Location Shoulder    Pain Orientation Right    Pain Descriptors / Indicators Aching    Pain Type Acute pain    Pain Onset More than a month ago    Pain Frequency Intermittent    Pain Score 4    Pain Location Back    Pain Orientation Right    Pain Descriptors /  Indicators Aching    Pain Type Chronic pain    Pain Onset More than a month ago    Pain Frequency Intermittent                             [facility] Adult PT Treatment/Exercise - 06/21/20 0001      Ambulation/Gait   Gait Comments 35 ft x 6 with RW.       Lumbar Exercises: Stretches   Active Hamstring Stretch 3 reps;30 seconds    Active Hamstring Stretch Limitations seated    Single Knee to Chest Stretch 2 reps;30 seconds;Right;Left    Pelvic Tilt 20 reps      Lumbar Exercises: Standing   Other Standing Lumbar Exercises March, HIp abd and HR x 20     Other Standing Lumbar Exercises side stepping at counter x 6;       Lumbar Exercises: Seated   Sit to Stand 10 reps    Sit to Stand Limitations with education on form and mechanics       Lumbar Exercises: Supine   Clam 20 reps    Clam Limitations GTB  Shoulder Exercises: Seated   Row 20 reps    Theraband Level (Shoulder Row) Level 2 (Red)    External Rotation 15 reps    Theraband Level (Shoulder External Rotation) Level 2 (Red)    Internal Rotation 15 reps    Theraband Level (Shoulder Internal Rotation) Level 2 (Red)      Manual Therapy   Passive ROM for R shoulder, all motions     Manual Traction Long leg distraction bil for lumbar pump x 3 min;                     PT Short Term Goals - 06/02/20 1412      PT SHORT TERM GOAL #1   Title Pt to be independent with initial HEP    Time 2    Period Weeks    Status Achieved    Target Date 05/04/20      PT SHORT TERM GOAL #2   Title Pt to demo ability to self correct posture, for optimal seated lumbar posture.    Time 2    Period Weeks    Status Achieved    Target Date 05/04/20             PT Long Term Goals - 06/02/20 1412      PT LONG TERM GOAL #1   Title Pt to be independent with final HEP for shoulder and back    Time 6    Period Weeks    Status On-going      PT LONG TERM GOAL #2   Title Pt to demo ability to self correct  lumbar posture at least 75% of the time in clinic.    Time 6    Period Weeks    Status Partially Met    Target Date 07/14/20      PT LONG TERM GOAL #3   Title Pt to demo improved AROM of R shoulder to at least 120 deg for elevation, to improve ability for ADLs and IADLs.    Time 6    Period Weeks    Status On-going    Target Date 07/14/20      PT LONG TERM GOAL #4   Title Pt to report decreased pain in back to 0-2/10 with standing , sitting, and activity    Time 6    Period Weeks    Status Partially Met    Target Date 07/14/20      PT LONG TERM GOAL #5   Title Pt to report decreased pain in R shoulder, to 0-2/10 with activity and ADLs.    Time 6    Period Weeks    Status On-going    Target Date 07/14/20                 Plan - 06/21/20 1250    Clinical Impression Statement Pt continues to require cuing for correct performance of exercises, and for sit to stand mechanics, despite review at each session. Reviewed importance of posture for back as well, and encouraged activity at home. Pt does HEP with encouragement and cuing from daughter. Pt to benefit from continued activity with HEP. Back pain showing some improvment, but has not had significant pain reduction in shoulder, due to chroninc nature of both shoulder and back pain.    Personal Factors and Comorbidities Age;Comorbidity 1    Comorbidities R shoulder pain, Back pain, Poor balance, CA,    Examination-Activity Limitations Reach Overhead;Squat;Carry;Dressing;Lift;Locomotion Level    Examination-Participation  Restrictions Meal Prep;Cleaning;Community Activity;Shop    Stability/Clinical Decision Making Evolving/Moderate complexity    Rehab Potential Good    PT Frequency 2x / week    PT Duration 6 weeks    PT Treatment/Interventions ADLs/Self Care Home Management;Cryotherapy;Electrical Stimulation;Gait training;DME Instruction;Ultrasound;Moist Heat;Iontophoresis 58m/ml Dexamethasone;Stair training;Traction;Functional  mobility training;Therapeutic activities;Therapeutic exercise;Neuromuscular re-education;Balance training;Manual techniques;Patient/family education;Passive range of motion;Dry needling;Vasopneumatic Device;Taping;Spinal Manipulations;Joint Manipulations    PT Home Exercise Plan [id number]    Consulted and Agree with Plan of Care Patient;Family member/caregiver           Patient will benefit from skilled therapeutic intervention in order to improve the following deficits and impairments:  Abnormal gait, Decreased range of motion, Difficulty walking, Impaired UE functional use, Increased muscle spasms, Decreased endurance, Decreased activity tolerance, Pain, Improper body mechanics, Impaired flexibility, Hypomobility, Decreased balance, Decreased knowledge of use of DME, Decreased mobility, Decreased strength, Postural dysfunction  Visit Diagnosis: Acute pain of right shoulder  Chronic bilateral low back pain without sciatica  Other abnormalities of gait and mobility     Problem List Patient Active Problem List   Diagnosis Date Noted  . Constipation 03/14/2020  . Low back pain 03/14/2020  . Mild neurocognitive disorder 08/19/2019  . Orthostasis 04/19/2019  . Essential hypertension 04/19/2019  . Hypothyroidism 04/19/2019  . Overactive bladder 04/19/2019  . IBS (irritable bowel syndrome) 04/19/2019  . GERD (gastroesophageal reflux disease) 04/19/2019  . Dyslipidemia 04/19/2019  . Glaucoma 04/19/2019  . Macular degeneration, left eye 10/21/2008   LLyndee Simmons PT, DPT 12:54 PM  06/21/20     [place] [address]Phone: [phone]  Fax:  [phone] Name: MBraylinn GuldenMRN: [id number] of Birth: 9November 10, 1927

## 2020-06-21 NOTE — Therapy (Signed)
[facility] [address] [place], [place], [address] Phone: [phone]   Fax:  [phone]  Physical Therapy Treatment  Patient Details  Name: Kerri Simmons MRN: [id number] Date of Birth: 1926/08/27 Referring Provider (PT): caleb Jerline Pain   Encounter Date: 06/21/2020   PT End of Session - 06/21/20 1649    Visit Number 14    Number of Visits 20    Date for PT Re-Evaluation 07/14/20    Authorization Type Medicare    Authorization Time Period Re-cert done at visit 9    PT Start Time 1605    PT Stop Time 1635    PT Time Calculation (min) 30 min    Equipment Utilized During Treatment Gait belt    Activity Tolerance Patient tolerated treatment well    Behavior During Therapy WFL for tasks assessed/performed           Past Medical History:  Diagnosis Date  . Allergy   . Arthritis   . Cancer ([place])   . Essential hypertension 04/19/2019  . GERD (gastroesophageal reflux disease) 04/19/2019  . Glaucoma 04/19/2019  . Hyperlipidemia   . Hypothyroidism 04/19/2019  . IBS (irritable bowel syndrome) 04/19/2019  . Macular degeneration, left eye 10/21/2008  . Migraines   . Orthostasis 04/19/2019  . Pneumonia   . Thyroid disease     Past Surgical History:  Procedure Laterality Date  . ABDOMINAL HYSTERECTOMY    . FETAL BLOOD TRANSFUSION    . SQUAMOUS CELL CARCINOMA EXCISION Left    upper left arm  . THYROID LOBECTOMY      There were no vitals filed for this visit.   Subjective Assessment - 06/21/20 1649    Subjective Pt with no new complaints.    Currently in Pain? Yes                             [facility] Adult PT Treatment/Exercise - 06/21/20 1647      Ambulation/Gait   Gait Comments 35 ft x 8 with RW.       Lumbar Exercises: Standing   Other Standing Lumbar Exercises March, HIp abd and HR x 20     Other Standing Lumbar Exercises side stepping at counter x 6;       Lumbar Exercises: Seated   Sit to Stand 5 reps                     PT Short Term Goals - 06/02/20 1412      PT SHORT TERM GOAL #1   Title Pt to be independent with initial HEP    Time 2    Period Weeks    Status Achieved    Target Date 05/04/20      PT SHORT TERM GOAL #2   Title Pt to demo ability to self correct posture, for optimal seated lumbar posture.    Time 2    Period Weeks    Status Achieved    Target Date 05/04/20             PT Long Term Goals - 06/02/20 1412      PT LONG TERM GOAL #1   Title Pt to be independent with final HEP for shoulder and back    Time 6    Period Weeks    Status On-going      PT LONG TERM GOAL #2   Title Pt to demo ability to self  correct lumbar posture at least 75% of the time in clinic.    Time 6    Period Weeks    Status Partially Met    Target Date 07/14/20      PT LONG TERM GOAL #3   Title Pt to demo improved AROM of R shoulder to at least 120 deg for elevation, to improve ability for ADLs and IADLs.    Time 6    Period Weeks    Status On-going    Target Date 07/14/20      PT LONG TERM GOAL #4   Title Pt to report decreased pain in back to 0-2/10 with standing , sitting, and activity    Time 6    Period Weeks    Status Partially Met    Target Date 07/14/20      PT LONG TERM GOAL #5   Title Pt to report decreased pain in R shoulder, to 0-2/10 with activity and ADLs.    Time 6    Period Weeks    Status On-going    Target Date 07/14/20                 Plan - 06/21/20 1650    Clinical Impression Statement After partial session of ther ex, pt became nausiated, requested to sit down. No other sympotoms or complaints. Denies dizziness, pain. Vitals stable, HR: 66, BP: 130/72, O2 98 %. Pt sat, rested for about 15 more minutes, was assisted out to car, daughter with her also. Pt still mildy nausiated, but no vomiting, and symptoms slightly decreased prior to leaving appt.Daughter states this is common at times for pt, and recomended she call MD if  symptoms worsen or do not improve.    Personal Factors and Comorbidities Age;Comorbidity 1    Comorbidities R shoulder pain, Back pain, Poor balance, CA,    Examination-Activity Limitations Reach Overhead;Squat;Carry;Dressing;Lift;Locomotion Level    Examination-Participation Restrictions Meal Prep;Cleaning;Community Activity;Shop    Stability/Clinical Decision Making Evolving/Moderate complexity    Rehab Potential Good    PT Frequency 2x / week    PT Duration 6 weeks    PT Treatment/Interventions ADLs/Self Care Home Management;Cryotherapy;Electrical Stimulation;Gait training;DME Instruction;Ultrasound;Moist Heat;Iontophoresis 38m/ml Dexamethasone;Stair training;Traction;Functional mobility training;Therapeutic activities;Therapeutic exercise;Neuromuscular re-education;Balance training;Manual techniques;Patient/family education;Passive range of motion;Dry needling;Vasopneumatic Device;Taping;Spinal Manipulations;Joint Manipulations    PT Home Exercise Plan [id number]    Consulted and Agree with Plan of Care Patient;Family member/caregiver           Patient will benefit from skilled therapeutic intervention in order to improve the following deficits and impairments:  Abnormal gait, Decreased range of motion, Difficulty walking, Impaired UE functional use, Increased muscle spasms, Decreased endurance, Decreased activity tolerance, Pain, Improper body mechanics, Impaired flexibility, Hypomobility, Decreased balance, Decreased knowledge of use of DME, Decreased mobility, Decreased strength, Postural dysfunction  Visit Diagnosis: Acute pain of right shoulder  Chronic bilateral low back pain without sciatica  Other abnormalities of gait and mobility     Problem List Patient Active Problem List   Diagnosis Date Noted  . Constipation 03/14/2020  . Low back pain 03/14/2020  . Mild neurocognitive disorder 08/19/2019  . Orthostasis 04/19/2019  . Essential hypertension 04/19/2019  .  Hypothyroidism 04/19/2019  . Overactive bladder 04/19/2019  . IBS (irritable bowel syndrome) 04/19/2019  . GERD (gastroesophageal reflux disease) 04/19/2019  . Dyslipidemia 04/19/2019  . Glaucoma 04/19/2019  . Macular degeneration, left eye 10/21/2008    LLyndee Hensen PT, DPT 4:53 PM  06/21/20    [facility]  [place], [place], [address] Phone: [phone]   Fax:  [phone]  Name: Kerri Simmons MRN: [id number] Date of Birth: [birth year]

## 2020-06-27 ENCOUNTER — Ambulatory Visit (INDEPENDENT_AMBULATORY_CARE_PROVIDER_SITE_OTHER): Payer: Medicare Other | Admitting: Family Medicine

## 2020-06-27 ENCOUNTER — Other Ambulatory Visit: Payer: Self-pay

## 2020-06-27 ENCOUNTER — Encounter: Payer: Self-pay | Admitting: Family Medicine

## 2020-06-27 ENCOUNTER — Encounter: Payer: Medicare Other | Admitting: Physical Therapy

## 2020-06-27 VITALS — BP 148/79 | HR 66 | Temp 97.4°F | Ht 61.5 in | Wt 107.6 lb

## 2020-06-27 DIAGNOSIS — M25511 Pain in right shoulder: Secondary | ICD-10-CM | POA: Diagnosis not present

## 2020-06-27 DIAGNOSIS — R634 Abnormal weight loss: Secondary | ICD-10-CM | POA: Diagnosis not present

## 2020-06-27 MED ORDER — METHYLPREDNISOLONE ACETATE 80 MG/ML IJ SUSP
80.0000 mg | Freq: Once | INTRAMUSCULAR | Status: AC
  Administered 2020-06-27: 80 mg via INTRA_ARTICULAR

## 2020-06-27 NOTE — Patient Instructions (Signed)
It was very nice to see you today!  We injected your shoulder today.  I will see you back in a few weeks.  Take care, Dr Jerline Pain  Please try these tips to maintain a healthy lifestyle:   Eat at least 3 REAL meals and 1-2 snacks per day.  Aim for no more than 5 hours between eating.  If you eat breakfast, please do so within one hour of getting up.    Each meal should contain half fruits/vegetables, one quarter protein, and one quarter carbs (no bigger than a computer mouse)   Cut down on sweet beverages. This includes juice, soda, and sweet tea.     Drink at least 1 glass of water with each meal and aim for at least 8 glasses per day   Exercise at least 150 minutes every week.    Joint Steroid Injection A joint steroid injection is a procedure to relieve swelling and pain in a joint. Steroids are medicines that reduce inflammation. In this procedure, your health care provider uses a syringe and a needle to inject a steroid medicine into a painful and inflamed joint. A pain-relieving medicine (anesthetic) may be injected along with the steroid. In some cases, your health care provider may use an imaging technique such as ultrasound or fluoroscopy to guide the injection. Joints that are often treated with steroid injections include the knee, shoulder, hip, and spine. These injections may also be used in the elbow, ankle, and joints of the hands or feet. You may have joint steroid injections as part of your treatment for inflammation caused by:  Gout.  Rheumatoid arthritis.  Advanced wear-and-tear arthritis (osteoarthritis).  Tendinitis.  Bursitis. Joint steroid injections may be repeated, but having them too often can damage a joint or the skin over the joint. You should not have joint steroid injections less than 6 weeks apart or more than four times a year. Tell a health care provider about:  Any allergies you have.  All medicines you are taking, including vitamins,  herbs, eye drops, creams, and over-the-counter medicines.  Any problems you or family members have had with anesthetic medicines.  Any blood disorders you have.  Any surgeries you have had.  Any medical conditions you have.  Whether you are pregnant or may be pregnant. What are the risks? Generally, this is a safe treatment. However, problems may occur, including:  Infection.  Bleeding.  Allergic reactions to medicines.  Damage to the joint or tissues around the joint.  Thinning of skin or loss of skin color over the joint.  Temporary flushing of the face or chest.  Temporary increase in pain.  Temporary increase in blood sugar.  Failure to relieve inflammation or pain. What happens before the treatment?  You may have imaging tests of your joint.  Ask your health care provider about: ? Changing or stopping your regular medicines. This is especially important if you are taking diabetes medicines or blood thinners. ? Taking medicines such as aspirin and ibuprofen. These medicines can thin your blood. Do not take these medicines unless your health care provider tells you to take them. ? Taking over-the-counter medicines, vitamins, herbs, and supplements.  Ask your health care provider if you can drive yourself home after the procedure. What happens during the treatment?   Your health care provider will position you for the injection and locate the injection site over your joint.  The skin over the joint will be cleaned with a germ-killing soap.  Your health  care provider may: ? Spray a numbing solution (topical anesthetic) over the injection site. ? Inject a local anesthetic under the skin above your joint.  The needle will be placed through your skin into your joint. Your health care provider may use imaging to guide the needle to the right spot for the injection. If imaging is used, a special contrast dye may be injected to confirm that the needle is in the correct  location.  The steroid medicine will be injected into your joint.  Anesthetic may be injected along with the steroid. This may be a medicine that relieves pain for a short time (short-acting anesthetic) or for a longer time (long-acting anesthetic).  The needle will be removed, and an adhesive bandage (dressing) will be placed over the injection site. The procedure may vary among health care providers and hospitals. What can I expect after the treatment?  You will be able to go home after the treatment.  It is normal to feel slight flushing for a few days after the injection.  After the treatment, it is common to have an increase in joint pain after the anesthetic has worn off. This may happen about an hour after a short-acting anesthetic or about 8 hours after a longer-acting anesthetic.  You should begin to feel relief from joint pain and swelling after 24 to 48 hours. Follow these instructions at home: Injection site care  Leave the adhesive dressing over your injection site in place until your health care provider says you can remove it.  Check your injection site every day for signs of infection. Check for: ? Redness, swelling, or pain. ? Fluid or blood. ? Warmth. ? Pus or a bad smell. Activity  Return to your normal activities as told by your health care provider. Ask your health care provider what activities are safe for you. You may be asked to limit activities that put stress on the joint for a few days.  Do joint exercises as told by your health care provider.  Do not take baths, swim, or use a hot tub until your health care provider approves. Managing pain, stiffness, and swelling   If directed, put ice on the joint. ? Put ice in a plastic bag. ? Place a towel between your skin and the bag. ? Leave the ice on for 20 minutes, 2-3 times a day.  Raise (elevate) your joint above the level of your heart when you are sitting or lying down. General instructions  Take  over-the-counter and prescription medicines only as told by your health care provider.  Do not use any products that contain nicotine or tobacco, such as cigarettes, e-cigarettes, and chewing tobacco. These can delay joint healing. If you need help quitting, ask your health care provider.  If you have diabetes, be aware that your blood sugar may be slightly elevated for several days after the injection.  Keep all follow-up visits as told by your health care provider. This is important. Contact a health care provider if you have:  Chills or a fever.  Any signs of infection at your injection site.  Increased pain or swelling or no relief after 2 days. Summary  A joint steroid injection is a treatment to relieve pain and swelling in a joint.  Steroids are medicines that reduce inflammation. Your health care provider may add an anesthetic along with the steroid.  You may have joint steroid injections as part of your arthritis treatment.  Joint steroid injections may be repeated, but having  them too often can damage a joint or the skin over the joint.  Contact your health care provider if you have a fever, chills, or signs of infection or if you get no relief from joint pain or swelling. This information is not intended to replace advice given to you by your health care provider. Make sure you discuss any questions you have with your health care provider. Document Revised: 06/09/2018 Document Reviewed: 06/09/2018 Elsevier Patient Education  2020 [employer].

## 2020-06-27 NOTE — Assessment & Plan Note (Signed)
Steroid injection performed today.  She tolerated well.  See below procedure note.

## 2020-06-27 NOTE — Progress Notes (Signed)
   Kerri Simmons is a 86 y.o. female who presents today for an office visit.  Assessment/Plan:  Chronic Problems Addressed Today: Acute pain of right shoulder Steroid injection performed today.  She tolerated well.  See below procedure note.  Weight loss Weight down 1 pound since last visit.  She will follow-up again in a few weeks.  Continue supplementation with boost or Ensure.     Subjective:  HPI:  Patient with right shoulder pain.  Last seen for this a couple weeks ago.  Has seen physical therapy.  She would like to have cortisone shot today.  Physical therapy seems to be helping.       Objective:  Physical Exam: BP (!) 148/79   Pulse 66   Temp (!) 97.4 F (36.3 C) (Temporal)   Ht 5' 1.5" (1.562 m)   Wt 107 lb 9.6 oz (48.8 kg)   SpO2 100%   BMI 20.00 kg/m   Wt Readings from Last 3 Encounters:  06/27/20 107 lb 9.6 oz (48.8 kg)  06/09/20 108 lb 9.6 oz (49.3 kg)  03/23/20 117 lb 3.2 oz (53.2 kg)    Gen: No acute distress, resting comfortably Neuro: Grossly normal, moves all extremities Psych: Normal affect and thought content  Shoulder Injection Procedure Note  Pre-operative Diagnosis: Right Shoulder Pain  Post-operative Diagnosis: same  Indications: Pain Relief   Anesthesia: Topical ethyl chloride  Procedure Details   Written consent was obtained for the procedure. The shoulder was prepped with iodine and the skin was anesthetized with topical ethyl choride. Using a 25 gauge needle the subacromial space was injected with 3 mL 1% lidocaine and 1 mL of 80cc/ml depomedrol under the posterior aspect of the acromion. The injection site was cleansed with topical isopropyl alcohol and a dressing was applied.  Complications:  None; patient tolerated the procedure well.      Algis Greenhouse. Jerline Pain, MD 06/27/2020 11:05 AM

## 2020-06-27 NOTE — Addendum Note (Signed)
Addended by: Betti Cruz on: 06/27/2020 11:10 AM   Modules accepted: Orders

## 2020-06-27 NOTE — Assessment & Plan Note (Signed)
Weight down 1 pound since last visit.  She will follow-up again in a few weeks.  Continue supplementation with boost or Ensure.

## 2020-06-29 ENCOUNTER — Other Ambulatory Visit: Payer: Self-pay

## 2020-06-29 ENCOUNTER — Ambulatory Visit (INDEPENDENT_AMBULATORY_CARE_PROVIDER_SITE_OTHER): Payer: Medicare Other | Admitting: Physical Therapy

## 2020-06-29 ENCOUNTER — Encounter: Payer: Self-pay | Admitting: Physical Therapy

## 2020-06-29 DIAGNOSIS — M545 Low back pain, unspecified: Secondary | ICD-10-CM

## 2020-06-29 DIAGNOSIS — M25511 Pain in right shoulder: Secondary | ICD-10-CM | POA: Diagnosis not present

## 2020-06-29 DIAGNOSIS — R2689 Other abnormalities of gait and mobility: Secondary | ICD-10-CM

## 2020-06-29 DIAGNOSIS — G8929 Other chronic pain: Secondary | ICD-10-CM

## 2020-06-29 NOTE — Therapy (Signed)
[facility] [address] [place], [place], [address] Phone: [phone]   Fax:  [phone]  Physical Therapy Treatment/Discharge   Patient Details  Name: Kerri Simmons MRN: [id number] Date of Birth: 1926/04/23 Referring Provider (PT): Kerri Simmons   Encounter Date: 06/29/2020   PT End of Session - 06/29/20 1652    Visit Number 15    Number of Visits 20    Date for PT Re-Evaluation 07/14/20    Authorization Type Medicare    Authorization Time Period Re-cert done at visit 9    PT Start Time 1603    PT Stop Time 1644    PT Time Calculation (min) 41 min    Equipment Utilized During Treatment Gait belt    Activity Tolerance Patient tolerated treatment well    Behavior During Therapy Kerri Simmons for tasks assessed/performed           Past Medical History:  Diagnosis Date  . Allergy   . Arthritis   . Cancer ([place])   . Essential hypertension 04/19/2019  . GERD (gastroesophageal reflux disease) 04/19/2019  . Glaucoma 04/19/2019  . Hyperlipidemia   . Hypothyroidism 04/19/2019  . IBS (irritable bowel syndrome) 04/19/2019  . Macular degeneration, left eye 10/21/2008  . Migraines   . Orthostasis 04/19/2019  . Pneumonia   . Thyroid disease     Past Surgical History:  Procedure Laterality Date  . ABDOMINAL HYSTERECTOMY    . FETAL BLOOD TRANSFUSION    . SQUAMOUS CELL CARCINOMA EXCISION Left    upper left arm  . THYROID LOBECTOMY      There were no vitals filed for this visit.   Subjective Assessment - 06/29/20 1651    Subjective Pt had injection in R shoulder. Unable to tell if she has had much Simmons relief yet. R UT tight. Back has been mildly stiff/sore. Pts daughter reports pt not doing much HEP unless she initiates.    Currently in Simmons? Yes    Simmons Score 4     Simmons Location Shoulder    Simmons Orientation Right    Simmons Descriptors / Indicators Aching    Simmons Type Acute Simmons    Simmons Onset More than a month ago    Simmons Frequency  Intermittent    Simmons Score 3    Simmons Location Back    Simmons Orientation Right    Simmons Descriptors / Indicators Aching    Simmons Type Chronic Simmons    Simmons Onset More than a month ago    Simmons Frequency Intermittent                             [facility] Adult PT Treatment/Exercise - 06/29/20 0001      Ambulation/Gait   Gait Comments 35 ft x 8 with RW.       Lumbar Exercises: Stretches   Single Knee to Chest Stretch 2 reps;30 seconds;Right;Left    Pelvic Tilt 20 reps      Lumbar Exercises: Standing   Other Standing Lumbar Exercises March, HIp abd and HR x 20       Lumbar Exercises: Seated   Sit to Stand 10 reps      Lumbar Exercises: Supine   Bent Knee Raise 20 reps      Shoulder Exercises: Supine   Flexion 10 reps;AAROM      Shoulder Exercises: Seated   Row 20 reps      Manual Therapy  Manual therapy comments STM for R UT    Passive ROM Stretching for R hip and HS                   PT Education - 06/29/20 1652    Education Details Final HEP reviewed    Person(s) Educated Patient    Methods Explanation;Demonstration;Verbal cues;Handout    Comprehension Verbalized understanding;Returned demonstration;Verbal cues required            PT Short Term Goals - 06/02/20 1412      PT SHORT TERM GOAL #1   Title Pt to be independent with initial HEP    Time 2    Period Weeks    Status Achieved    Target Date 05/04/20      PT SHORT TERM GOAL #2   Title Pt to demo ability to self correct posture, for optimal seated lumbar posture.    Time 2    Period Weeks    Status Achieved    Target Date 05/04/20             PT Long Term Goals - 06/29/20 1653      PT LONG TERM GOAL #1   Title Pt to be independent with final HEP for shoulder and back    Time 6    Period Weeks    Status Achieved      PT LONG TERM GOAL #2   Title Pt to demo ability to self correct lumbar posture at least 75% of the time in clinic.    Time 6    Period Weeks    Status  Achieved      PT LONG TERM GOAL #3   Title Pt to demo improved AROM of R shoulder to at least 120 deg for elevation, to improve ability for ADLs and IADLs.    Baseline up to 110    Time 6    Period Weeks    Status Partially Met      PT LONG TERM GOAL #4   Title Pt to report decreased Simmons in back to 0-2/10 with standing , sitting, and activity    Time 6    Period Weeks    Status Partially Met      PT LONG TERM GOAL #5   Title Pt to report decreased Simmons in R shoulder, to 0-2/10 with activity and ADLs.    Time 6    Period Weeks    Status Partially Met                 Plan - 06/29/20 1654    Clinical Impression Statement Pt continues to have mild/chronic Simmons in R shoulder, UT, and in back. Pt has not made significant improvments in Simmons in last couple weeks. She is doing better with strengthening, but needs encouragement for HEP. Pt with improved safety and efficiency with use of RW, but does not remember to use at home, unless daughter reminds her. Pt safe for independent mobility at home.  Goals partially met, pt ready for d/c to HEp at this time, pt and daughter in agreement with plan.    Personal Factors and Comorbidities Age;Comorbidity 1    Comorbidities R shoulder Simmons, Back Simmons, Poor balance, CA,    Examination-Activity Limitations Reach Overhead;Squat;Carry;Dressing;Lift;Locomotion Level    Examination-Participation Restrictions Meal Prep;Cleaning;Community Activity;Shop    Stability/Clinical Decision Making Evolving/Moderate complexity    Rehab Potential Good    PT Frequency 2x / week    PT Duration  6 weeks    PT Treatment/Interventions ADLs/Self Care Home Management;Cryotherapy;Electrical Stimulation;Gait training;DME Instruction;Ultrasound;Moist Heat;Iontophoresis 73m/ml Dexamethasone;Stair training;Traction;Functional mobility training;Therapeutic activities;Therapeutic exercise;Neuromuscular re-education;Balance training;Manual techniques;Patient/family  education;Passive range of motion;Dry needling;Vasopneumatic Device;Taping;Spinal Manipulations;Joint Manipulations    PT Home Exercise Plan [email or web address]    Consulted and Agree with Plan of Care Patient;Family member/caregiver           Patient will benefit from skilled therapeutic intervention in order to improve the following deficits and impairments:  Abnormal gait, Decreased range of motion, Difficulty walking, Impaired UE functional use, Increased muscle spasms, Decreased endurance, Decreased activity tolerance, Simmons, Improper body mechanics, Impaired flexibility, Hypomobility, Decreased balance, Decreased knowledge of use of DME, Decreased mobility, Decreased strength, Postural dysfunction  Visit Diagnosis: Acute Simmons of right shoulder  Chronic bilateral low back Simmons without sciatica  Other abnormalities of gait and mobility     Problem List Patient Active Problem List   Diagnosis Date Noted  . Acute Simmons of right shoulder 06/27/2020  . Weight loss 06/27/2020  . Constipation 03/14/2020  . Low back Simmons 03/14/2020  . Mild neurocognitive disorder 08/19/2019  . Orthostasis 04/19/2019  . Essential hypertension 04/19/2019  . Hypothyroidism 04/19/2019  . Overactive bladder 04/19/2019  . IBS (irritable bowel syndrome) 04/19/2019  . GERD (gastroesophageal reflux disease) 04/19/2019  . Dyslipidemia 04/19/2019  . Glaucoma 04/19/2019  . Macular degeneration, left eye 10/21/2008    Kerri Simmons PT, DPT 4:59 PM  06/29/20    [facility] [address]Phone: [phone]  Fax:  [phone] Name: Kerri BrionesMRN: [id number] of Birth: [birth year]  PHYSICAL THERAPY DISCHARGE SUMMARY  Visits from Start of Care: 15 Plan: Patient agrees to discharge.  Patient goals were partially met. Patient is being discharged due to                                                     ?????   Goals partially  met, Pt doing well at this time managing chronic Simmons.   Kerri Simmons PT, DPT 4:58 PM  06/29/20

## 2020-06-29 NOTE — Patient Instructions (Signed)
Access Code: [email or web address] URL: [email or web address] Date: 06/29/2020 Prepared by: Lyndee Hensen  Exercises Supine Shoulder Flexion Extension AAROM with [place]el - 2 x daily - 1 sets - 10 reps Supine Posterior Pelvic Tilt - 2 x daily - 1 sets - 10 reps Supine Single Knee to Chest Stretch - 2 x daily - 3 reps - 30 hold Supine March - 1 x daily - 2 sets - 10 reps Seated Hamstring Stretch - 2 x daily - 3 reps - 30 hold Seated Scapular Retraction - 2 x daily - 1 sets - 10 reps Sit to Stand - 1 x daily - 1 sets - 5-10 reps Standing March with Counter Support - 1 x daily - 2 sets - 10 reps Standing Hip Abduction with Counter Support - 1 x daily - 2 sets - 10 reps Heel rises with counter support - 1 x daily - 2 sets - 10 reps

## 2020-07-01 IMAGING — CT CT ABD-PELV W/ CM
2 of 5 series · 15 of 46 positions shown, 17 images · IV contrast (OMNIPAQUE)
Comparison: None.

CLINICAL DATA: Left upper quadrant abdominal pain.

EXAM:
CT ABDOMEN AND PELVIS WITH CONTRAST
TECHNIQUE: Multidetector CT imaging of the abdomen and pelvis was performed
using the standard protocol following bolus administration of
intravenous contrast.
CONTRAST:  75mL OMNIPAQUE IOHEXOL 300 MG/ML  SOLN

[Series 2: axial st · axial · 0.72mm/px · z∈[-272,+98]mm · 12 of 88 slices shown, 14 images]
[im 7/88  soft-tissue]
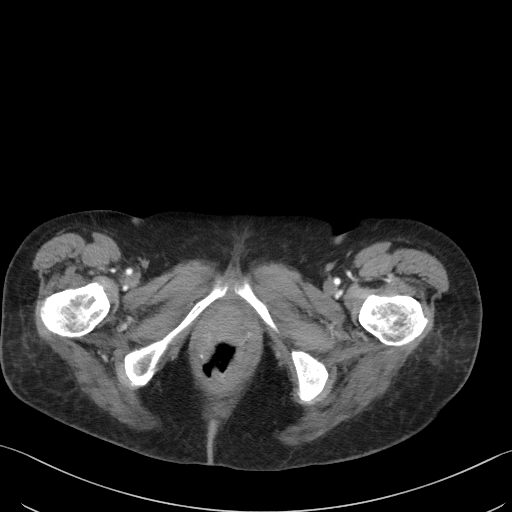
[im 7/88  bone]
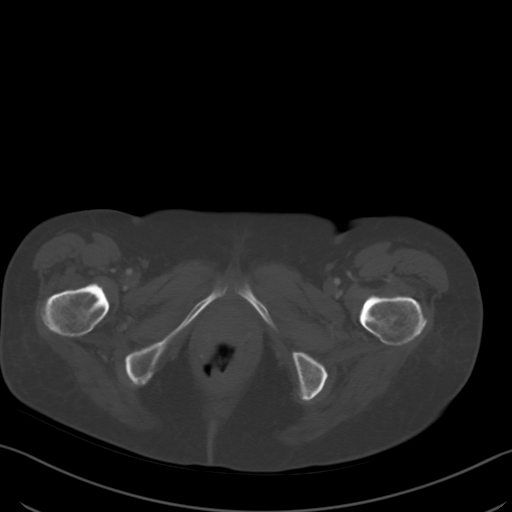
[im 14/88  soft-tissue]
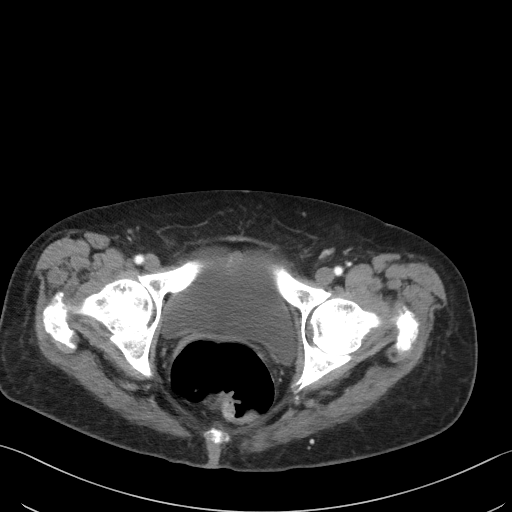
[im 21/88  soft-tissue]
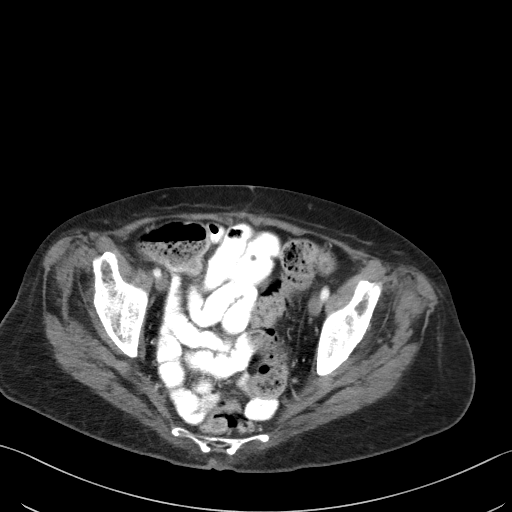
[im 27/88  soft-tissue]
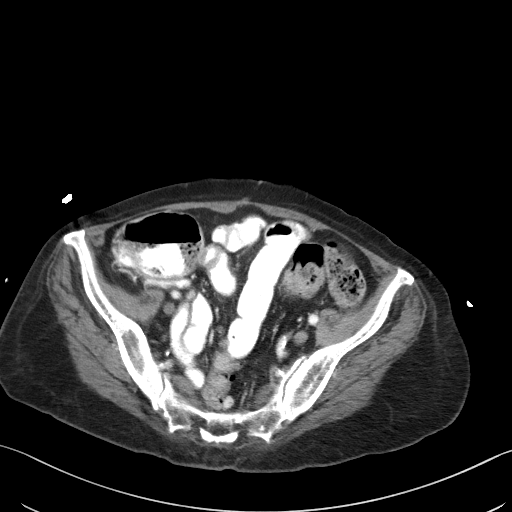
[im 34/88  soft-tissue]
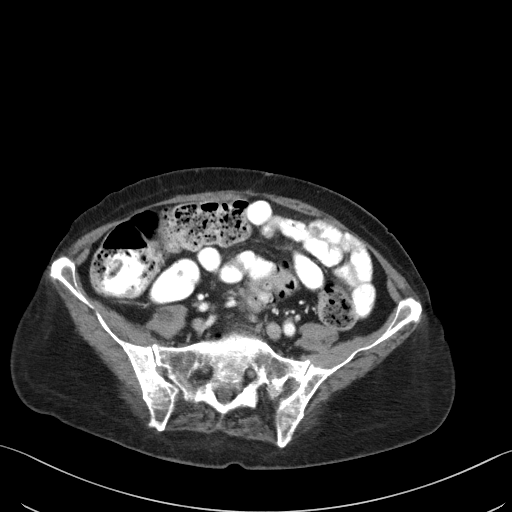
[im 41/88  soft-tissue]
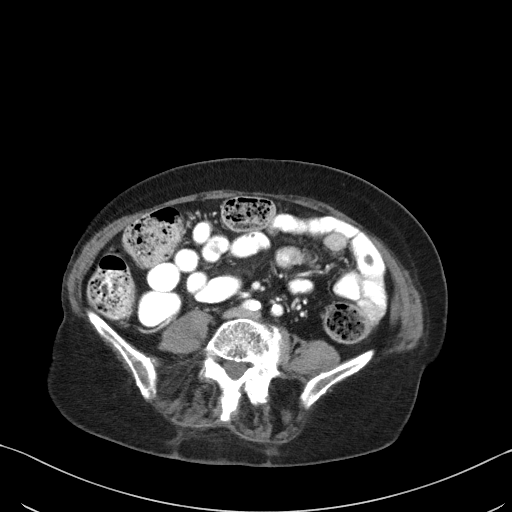
[im 47/88  soft-tissue]
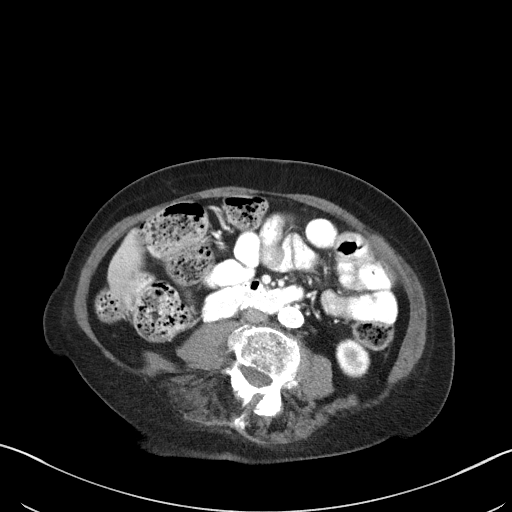
[im 54/88  soft-tissue]
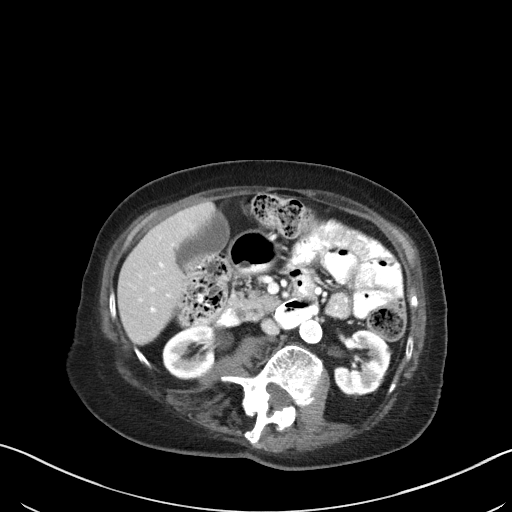
[im 61/88  soft-tissue]
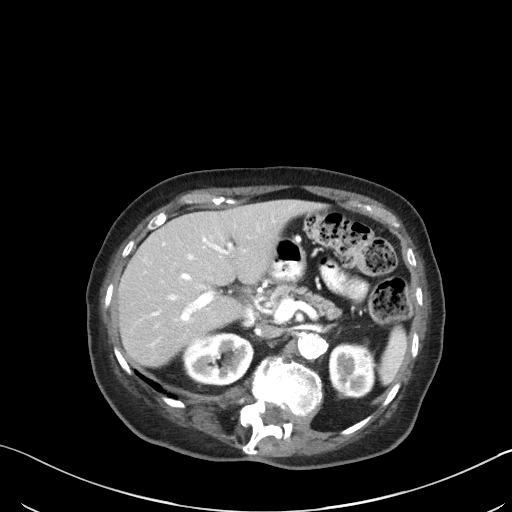
[im 61/88  bone]
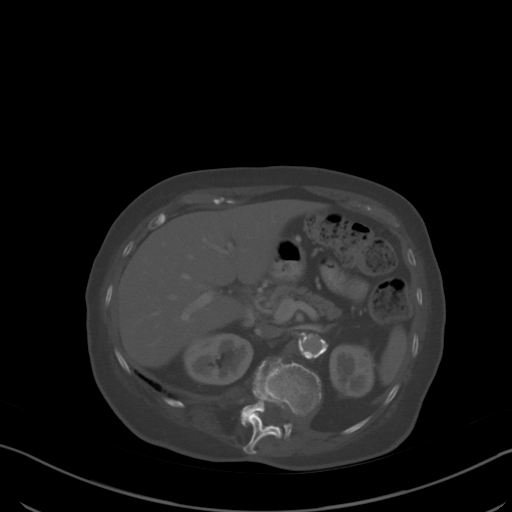
[im 67/88  soft-tissue]
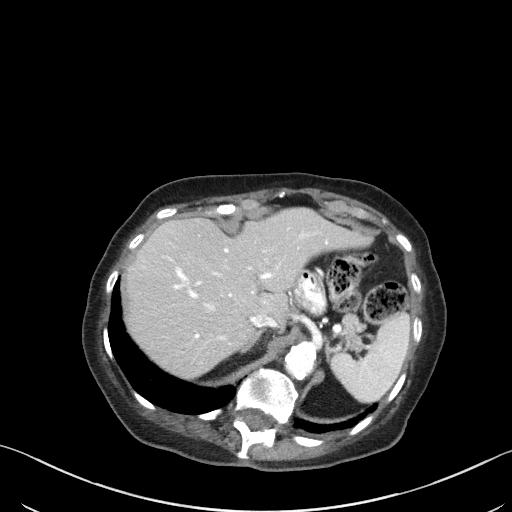
[im 74/88  soft-tissue]
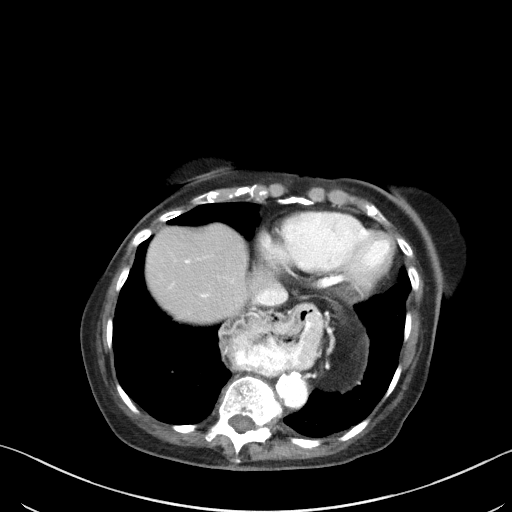
[im 81/88  soft-tissue]
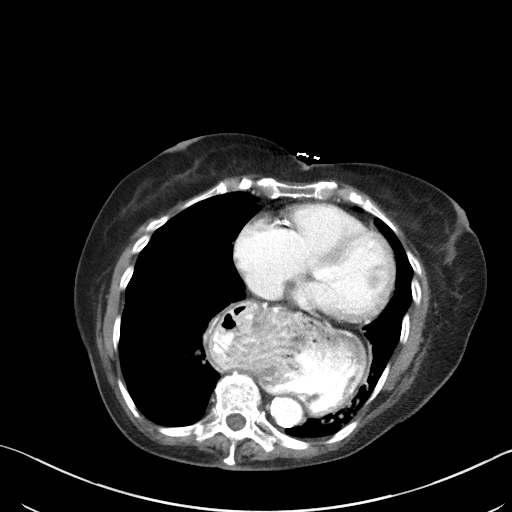

[Series 4: coronal st · coronal · 0.84mm/px · 3 of 70 slices shown]
[im 24/70  soft-tissue]
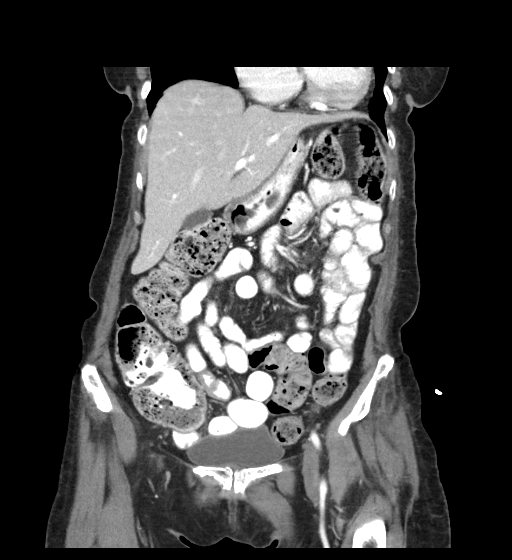
[im 31/70  soft-tissue]
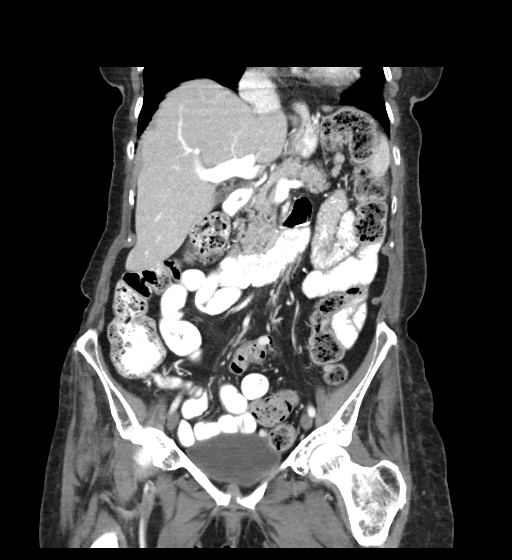
[im 39/70  soft-tissue]
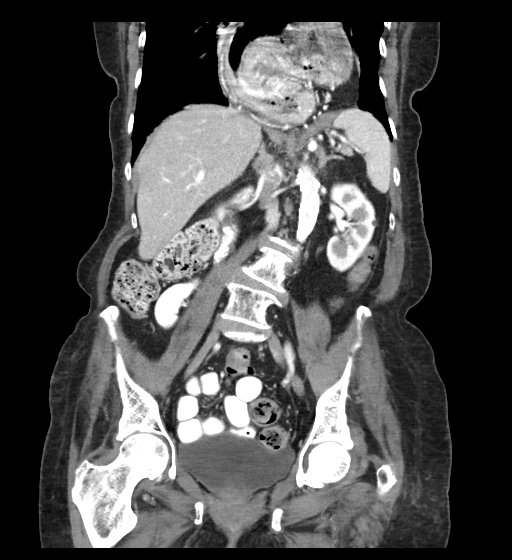

[15 of 46 positions shown; findings below may reference images not displayed]

FINDINGS: Lower chest: The lung bases are clear. The heart size is normal.

Hepatobiliary: The liver is normal. Normal gallbladder.There is no
biliary ductal dilation.

Pancreas: Normal contours without ductal dilatation. No
peripancreatic fluid collection.

Spleen: No splenic laceration or hematoma.

Adrenals/Urinary Tract:

--Adrenal glands: No adrenal hemorrhage.

--Right kidney/ureter: No hydronephrosis or perinephric hematoma.

--Left kidney/ureter: Multiple small left-sided renal cysts are
noted.

--Urinary bladder: Unremarkable.

Stomach/Bowel:

--Stomach/Duodenum: There is a large hiatal hernia with the majority
of the stomach herniated into the thoracic cavity.

--Small bowel: No dilatation or inflammation.

--Colon: There is a large stool burden. There is scattered
diverticula without CT evidence for diverticulitis.

--Appendix: Not visualized. No right lower quadrant inflammation or
free fluid.

Vascular/Lymphatic: Atherosclerotic calcification is present within
the non-aneurysmal abdominal aorta, without hemodynamically
significant stenosis. There is moderate severe narrowing at the
origin of the SMA. The celiac axis is widely patent. The IMA is
widely patent.

--No retroperitoneal lymphadenopathy.

--No mesenteric lymphadenopathy.

--No pelvic or inguinal lymphadenopathy.

Reproductive: Status post hysterectomy. No adnexal mass.

Other: No ascites or free air. The abdominal wall is normal.

Musculoskeletal. No acute displaced fractures.
IMPRESSION: 1. Large hiatal hernia with the majority of the stomach herniated
into the thoracic cavity.
2. Colonic diverticulosis without diverticulitis.
3. Moderate severe narrowing of the origin of the SMA. Patent celiac
axis and IMA.
4. Above average stool burden.
5. Aortic Atherosclerosis ([id number]-ZHH.H).

## 2020-07-04 ENCOUNTER — Encounter: Payer: Medicare Other | Admitting: Physical Therapy

## 2020-07-06 ENCOUNTER — Encounter: Payer: Medicare Other | Admitting: Physical Therapy

## 2020-07-18 ENCOUNTER — Encounter: Payer: Self-pay | Admitting: Neurology

## 2020-07-18 ENCOUNTER — Other Ambulatory Visit: Payer: Self-pay

## 2020-07-18 ENCOUNTER — Ambulatory Visit (INDEPENDENT_AMBULATORY_CARE_PROVIDER_SITE_OTHER): Payer: Medicare Other | Admitting: Neurology

## 2020-07-18 VITALS — BP 135/74 | HR 57 | Ht 61.5 in | Wt 106.6 lb

## 2020-07-18 DIAGNOSIS — F0390 Unspecified dementia without behavioral disturbance: Secondary | ICD-10-CM | POA: Diagnosis not present

## 2020-07-18 DIAGNOSIS — F03A0 Unspecified dementia, mild, without behavioral disturbance, psychotic disturbance, mood disturbance, and anxiety: Secondary | ICD-10-CM

## 2020-07-18 NOTE — Progress Notes (Signed)
NEUROLOGY FOLLOW UP OFFICE NOTE  Kerri Simmons [id number] 1926-09-07  HISTORY OF PRESENT ILLNESS: I had the pleasure of seeing Kerri Simmons in follow-up in the neurology clinic on 07/18/2020.  The patient was last evaluated in the neurology clinic by telephone 6 months ago for worsening memory. She is again accompanied by her daughter who helps supplement the history today.  Records and images were personally reviewed where available.  On her last visit, family reported continued progression, she has minimal insight into her condition. She was started on Rivastigmine in March 2021, dose increased to 64m BID in April 2021. No side effects. Her daughter BDalene Seltzercontacted our office prior to this visit to report continued worsening. [place] and when they cam beck to [place] she did not recall the trip and demanded to know when she would be living on her won. Family are looking into moving to a senior community. She requires prompting to take her medications, she does home PT exercises and eats meals regularly although it seems she is never hungry. Family has a hard time reminding her to eat or take Ensure. She has lost 10 lbs. She denies any headaches, dizziness, focal numbness/tingling/weakness. BDalene Seltzerreports she is off balance a lot, which is chronic. She has PT exercises that they try to do at home. She reports she is sleeping well, however BDalene Seltzerreminds her that she frequently does not sleep too well, waking up to use the bathroom then unable to go back to sleep. She occasionally naps. She occasionally takes Tylenol PM to sleep better. She is staying with BDalene Seltzer then [place] She is able to bathe and dress herself. Family makes sure she takes her medications, she would forget to take it 2-3 times a week if not prompted. She tries to read large print books. She had a minor fall in June, no injuries. She  was instructed by PT to use a walker but she does not use it regularly. No hallucinations or paranoia.   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 Her daughter BDalene Seltzerwas present during the visit and JKennyth Losewas on the phone to provide additional information. She states she did not really know she was having a problem with her memory and was surprised when her daughters raised concern. She had been living alone, very independent until she had a sudden change in cognitive status between March and June. [place] her after her surgery, and had been sorting out Jackie's medications and giving them to her. They started noticing changes from March to June which they attributed to being isolated with the pandemic. BDalene Seltzernoticed that her medications were messed up, her pillpacks were half taken. She was repeating herself and forgetting things that she would usually know, for instance she is a retired nMarine scientistand her granddaughter is a nMarine scientistas well, but she asked what kind of work her granddaughter does or where a certain child is working. All of her bills are on autopay. She stopped driving several years ago. Family reports that the changes are "not terrible but something is going on." She is independent with dressing and bathing. Sleep is good, no wandering behavior. No paranoia or hallucinations. Mood is  okay but [name] notices that when she is home alone "she loses her sparkle."   She has occasional headaches with pain on the left temporal region. She has occasional dizziness. No diplopia, dysarthria/dysphagia, neck pain, focal numbness/tingling/weakness, bowel/bladder dysfunction, anosmia, or tremors. She has noticed numbness in her fingertips that she cannot hold on to things. She uses  a cane in open spaces because her balance is off. She denies any falls but states she hit her head on the door, again pointing to the left temporal region. Her father and several siblings had vascular dementia in their mid-84s. She denies any alcohol use.   Diagnostic Data: Head CT without contrast done 05/25/2019 did not show any acute changes, there was moderate diffuse volume loss and chronic microvascular disease.  Neuropsychological testing in October 2020 showed primary difficulties surrounding cognitive flexibility and information retrieval, semantic fluency and verbal learning and memory. Since she was completing ADLs fine, she met criteria for Mild Neurocognitive Disoder (ie MCI). Etiology of deficits unclear, Alzheimer's versus vascular, some parts of testing were inconsistent with either presentation.     PAST MEDICAL HISTORY: Past Medical History:  Diagnosis Date  . Allergy   . Arthritis   . Cancer ([place])   . Essential hypertension 04/19/2019  . GERD (gastroesophageal reflux disease) 04/19/2019  . Glaucoma 04/19/2019  . Hyperlipidemia   . Hypothyroidism 04/19/2019  . IBS (irritable bowel syndrome) 04/19/2019  . Macular degeneration, left eye 10/21/2008  . Migraines   . Orthostasis 04/19/2019  . Pneumonia   . Thyroid disease     MEDICATIONS: Current Outpatient Medications on File Prior to Visit  Medication Sig Dispense Refill  . cycloSPORINE (RESTASIS OP) Apply to eye.    . diclofenac (VOLTAREN) 50 MG EC tablet Take 1 tablet (50 mg total) by mouth 2 (two) times daily. 60 tablet 5  . dicyclomine (BENTYL) 10 MG capsule Take 1 capsule (10 mg total) by mouth 4 (four) times daily -  before meals and at bedtime. Takes as needed 120 capsule 3  . esomeprazole (NEXIUM) 40 MG capsule Take 1 capsule (40 mg total) by mouth 2 (two) times daily before a meal. 180 capsule 3  . ferrous sulfate 325 (65 FE) MG tablet Take 325 mg by mouth daily with breakfast.    . fluticasone (FLONASE) 50 MCG/ACT  nasal spray Place into both nostrils daily.    Marland Kitchen loratadine (CLARITIN) 10 MG tablet Take 10 mg by mouth daily.    . mirabegron ER (MYRBETRIQ) 25 MG TB24 tablet Take 1 tablet (25 mg total) by mouth daily. 90 tablet 2  . Multiple Minerals-Vitamins (CALCIUM CITRATE-MAG-MINERALS PO) Take by mouth.    . Multiple Vitamins-Minerals (PRESERVISION AREDS 2 PO) Take by mouth 2 (two) times a day.    . ondansetron (ZOFRAN ODT) 8 MG disintegrating tablet Take 1 tablet (8 mg total) by mouth every 8 (eight) hours as needed for nausea or vomiting. 20 tablet 0  . ramipril (ALTACE) 10 MG capsule Take 1 capsule (10 mg total) by mouth daily. 180 capsule 2  . rivastigmine (EXELON) 3 MG capsule Take 1 capsule (3 mg total) by mouth 2 (two) times daily. 180 capsule 3  . SYNTHROID 50 MCG tablet TAKE 1 TABLET DAILY BEFORE BREAKFAST 90 tablet 1   No current facility-administered medications on file prior to visit.    ALLERGIES: Allergies  Allergen Reactions  . Sulfa Antibiotics     FAMILY HISTORY: Family History  Problem Relation Age of Onset  . Dementia  Father   . Congenital heart disease Father   . Dementia Brother        Vascular  . Heart disease Brother   . Diabetes Mother   . Heart disease Mother   . Diabetes Sister   . Heart disease Sister   . Diabetes Sister   . Colon cancer Neg Hx   . Esophageal cancer Neg Hx   . Stomach cancer Neg Hx   . Pancreatic cancer Neg Hx     SOCIAL HISTORY: Social History   Socioeconomic History  . Marital status: Widowed    Spouse name: Not on file  . Number of children: 2  . Years of education: 56  . Highest education level: Not on file  Occupational History  . Not on file  Tobacco Use  . Smoking status: Never Smoker  . Smokeless tobacco: Never Used  Vaping Use  . Vaping Use: Never used  Substance and Sexual Activity  . Alcohol use: Never  . Drug use: Never  . Sexual activity: Not Currently  Other Topics Concern  . Not on file  Social History  Narrative   Lives with daughter at this time      Right handed      Retired Therapist, sports   Social Determinants of Radio broadcast assistant Strain:   . Difficulty of Paying Living Expenses: Not on file  Food Insecurity:   . Worried About Charity fundraiser in the Last Year: Not on file  . Ran Out of Food in the Last Year: Not on file  Transportation Needs:   . Lack of Transportation (Medical): Not on file  . Lack of Transportation (Non-Medical): Not on file  Physical Activity:   . Days of Exercise per Week: Not on file  . Minutes of Exercise per Session: Not on file  Stress:   . Feeling of Stress : Not on file  Social Connections:   . Frequency of Communication with Friends and Family: Not on file  . Frequency of Social Gatherings with Friends and Family: Not on file  . Attends Religious Services: Not on file  . Active Member of Clubs or Organizations: Not on file  . Attends Archivist Meetings: Not on file  . Marital Status: Not on file  Intimate Partner Violence:   . Fear of Current or Ex-Partner: Not on file  . Emotionally Abused: Not on file  . Physically Abused: Not on file  . Sexually Abused: Not on file     PHYSICAL EXAM: Vitals:   07/18/20 1139  BP: 135/74  Pulse: (!) 57  SpO2: 100%   General: No acute distress Head:  Normocephalic/atraumatic Skin/Extremities: No rash, no edema Neurological Exam: alert and oriented to person, state, season. No aphasia or dysarthria. Fund of knowledge is reduced.  Recent and remote memory are impaired. Attention and concentration are normal.  MMSE 21/30. MMSE - Mini Mental State Exam 07/18/2020  Orientation to time 1  Orientation to Place 2  Registration 3  Attention/ Calculation 5  Recall 1  Language- name 2 objects 2  Language- repeat 1  Language- follow 3 step command 3  Language- read & follow direction 1  Write a sentence 1  Copy design 1  Total score 21    Cranial nerves: Pupils equal, round. Extraocular  movements intact with no nystagmus. Visual fields full.  No facial asymmetry.  Motor: Bulk and tone normal, muscle strength 5/5 throughout with no pronator drift.   Finger to  nose testing intact.  Gait slow and cautious, unsteady without walker.   IMPRESSION: This is a pleasant 86 yo RH woman with a history of hypertension, hypothyroidism, with worsening memory. Neuropsychological testing in October 2020 indicated Mild Neurocognitive Disorder, vascular versus Alzheimer's. Head CT showed diffuse atrophy and chronic microvascular disease. She has had continued progression, now at dementia level requiring increased assistance with complex tasks. Continue Rivastigmine 45m BID. Discussed sleep and appetite issues, I am hesitant to add on mirtazapine due to age, recommend continued nonpharmacological management, encouragement, we can certainly start a low dose in the future if necessary and monitor side effects. Continue close supervision. She does not drive. Follow-up in 6 months, they know to call for any changes.    Thank you for allowing me to participate in her care.  Please do not hesitate to call for any questions or concerns.   KEllouise Newer M.D.   CC: Dr. PJerline Pain

## 2020-07-18 NOTE — Patient Instructions (Addendum)
Good to see you!  1. Continue Rivastigmine 3mg  twice a day  2. Continue to monitor sleep, an option is starting a medication called Mirtazapine which helps with sleep and appetite, however we would start a very low dose  3. Continue 24/7 care  4. Follow-up in 6 months, call for any changes   FALL PRECAUTIONS: Be cautious when walking. Scan the area for obstacles that may increase the risk of trips and falls. When getting up in the mornings, sit up at the edge of the bed for a few minutes before getting out of bed. Consider elevating the bed at the head end to avoid drop of blood pressure when getting up. Walk always in a well-lit room (use night lights in the walls). Avoid area rugs or power cords from appliances in the middle of the walkways. Use a walker or a cane if necessary and consider physical therapy for balance exercise. Get your eyesight checked regularly.   HOME SAFETY: Consider the safety of the kitchen when operating appliances like stoves, microwave oven, and blender. Consider having supervision and share cooking responsibilities until no longer able to participate in those. Accidents with firearms and other hazards in the house should be identified and addressed as well.   ABILITY TO BE LEFT ALONE: If patient is unable to contact 911 operator, consider using LifeLine, or when the need is there, arrange for someone to stay with patients. Smoking is a fire hazard, consider supervision or cessation. Risk of wandering should be assessed by caregiver and if detected at any point, supervision and safe proof recommendations should be instituted.  RECOMMENDATIONS FOR ALL PATIENTS WITH MEMORY PROBLEMS: 1. Continue to exercise (Recommend 30 minutes of walking everyday, or 3 hours every week) 2. Increase social interactions - continue going to [place] and enjoy social gatherings with friends and family 3. Eat healthy, avoid fried foods and eat more fruits and vegetables 4. Maintain adequate  blood pressure, blood sugar, and blood cholesterol level. Reducing the risk of stroke and cardiovascular disease also helps promoting better memory. 5. Avoid stressful situations. Live a simple life and avoid aggravations. Organize your time and prepare for the next day in anticipation. 6. Sleep well, avoid any interruptions of sleep and avoid any distractions in the bedroom that may interfere with adequate sleep quality 7. Avoid sugar, avoid sweets as there is a strong link between excessive sugar intake, diabetes, and cognitive impairment The Mediterranean diet has been shown to help patients reduce the risk of progressive memory disorders and reduces cardiovascular risk. This includes eating fish, eat fruits and green leafy vegetables, nuts like almonds and hazelnuts, walnuts, and also use olive oil. Avoid fast foods and fried foods as much as possible. Avoid sweets and sugar as sugar use has been linked to worsening of memory function.  There is always a concern of gradual progression of memory problems. If this is the case, then we may need to adjust level of care according to patient needs. Support, both to the patient and caregiver, should then be put into place.

## 2020-07-18 NOTE — Progress Notes (Signed)
Weight lose of 10 pound follow up with PCP tomorrow.

## 2020-07-19 ENCOUNTER — Ambulatory Visit (INDEPENDENT_AMBULATORY_CARE_PROVIDER_SITE_OTHER): Payer: Medicare Other | Admitting: Family Medicine

## 2020-07-19 VITALS — BP 149/72 | HR 67 | Temp 98.0°F | Ht 61.5 in | Wt 105.6 lb

## 2020-07-19 DIAGNOSIS — M25511 Pain in right shoulder: Secondary | ICD-10-CM

## 2020-07-19 DIAGNOSIS — K219 Gastro-esophageal reflux disease without esophagitis: Secondary | ICD-10-CM

## 2020-07-19 DIAGNOSIS — R634 Abnormal weight loss: Secondary | ICD-10-CM | POA: Diagnosis not present

## 2020-07-19 NOTE — Assessment & Plan Note (Signed)
Stable.  Continue Nexium per GI.

## 2020-07-19 NOTE — Progress Notes (Signed)
   Kerri Simmons is a 86 y.o. female who presents today for an office visit.  Assessment/Plan:  Chronic Problems Addressed Today: Weight loss Weight overall stable.  She is down about 2 pounds since last time.  Discussed importance of caloric intake.  Discussed starting Remeron however we decided to defer for the time being.  They will follow-up in 3 months.  Acute pain of right shoulder Doing much better with steroid injection.  Follow-up in 3 months.  Can repeat injection every 3 months if needed.  GERD (gastroesophageal reflux disease) Stable.  Continue Nexium per GI.     Subjective:  HPI:  Patient here for follow-up.  Last seen about 3 weeks ago.  We performed steroid injection at that time in her right shoulder.  Has had some improvement since then.  She saw her neurologist yesterday.  Overall symptoms are stable.  Her appetite has not been the best per her daughter       Objective:  Physical Exam: BP (!) 149/72   Pulse 67   Temp 98 F (36.7 C) (Temporal)   Ht 5' 1.5" (1.562 m)   Wt 105 lb 9.6 oz (47.9 kg)   SpO2 100%   BMI 19.63 kg/m   Wt Readings from Last 3 Encounters:  07/19/20 105 lb 9.6 oz (47.9 kg)  07/18/20 106 lb 9.6 oz (48.4 kg)  06/27/20 107 lb 9.6 oz (48.8 kg)    Gen: No acute distress, resting comfortably Neuro: Grossly normal, moves all extremities Psych: Normal affect and thought content      Spyros Winch M. Jerline Pain, MD 07/19/2020 11:58 AM

## 2020-07-19 NOTE — Assessment & Plan Note (Signed)
Weight overall stable.  She is down about 2 pounds since last time.  Discussed importance of caloric intake.  Discussed starting Remeron however we decided to defer for the time being.  They will follow-up in 3 months.

## 2020-07-19 NOTE — Assessment & Plan Note (Signed)
Doing much better with steroid injection.  Follow-up in 3 months.  Can repeat injection every 3 months if needed.

## 2020-07-19 NOTE — Patient Instructions (Signed)
It was very nice to see you today!  I am glad that your shoulder is feeling better.  He get your flu vaccine in a couple of weeks.  Please continue to get as many calories as you can.  I will see you back in 3 months.  Please come back to see me sooner if needed.  Take care, Dr Jerline Pain  Please try these tips to maintain a healthy lifestyle:   Eat at least 3 REAL meals and 1-2 snacks per day.  Aim for no more than 5 hours between eating.  If you eat breakfast, please do so within one hour of getting up.    Each meal should contain half fruits/vegetables, one quarter protein, and one quarter carbs (no bigger than a computer mouse)   Cut down on sweet beverages. This includes juice, soda, and sweet tea.     Drink at least 1 glass of water with each meal and aim for at least 8 glasses per day   Exercise at least 150 minutes every week.

## 2020-07-21 ENCOUNTER — Ambulatory Visit: Payer: Medicare Other | Admitting: Family Medicine

## 2020-09-12 ENCOUNTER — Other Ambulatory Visit: Payer: Self-pay | Admitting: Family Medicine

## 2020-10-18 ENCOUNTER — Other Ambulatory Visit: Payer: Self-pay | Admitting: Family Medicine

## 2020-10-27 ENCOUNTER — Ambulatory Visit: Payer: Medicare Other | Admitting: Family Medicine

## 2020-10-31 ENCOUNTER — Ambulatory Visit: Payer: Medicare Other | Admitting: Family Medicine

## 2020-12-08 ENCOUNTER — Other Ambulatory Visit: Payer: Self-pay

## 2020-12-08 ENCOUNTER — Telehealth: Payer: Self-pay | Admitting: Neurology

## 2020-12-08 MED ORDER — RIVASTIGMINE TARTRATE 3 MG PO CAPS: 3.0000 mg | ORAL_CAPSULE | Freq: Two times a day (BID) | ORAL | 0 refills | Status: AC

## 2020-12-08 NOTE — Telephone Encounter (Signed)
Refills sent to last until appointment

## 2020-12-11 ENCOUNTER — Other Ambulatory Visit: Payer: Self-pay | Admitting: *Deleted

## 2020-12-11 MED ORDER — ESOMEPRAZOLE MAGNESIUM 40 MG PO CPDR: DELAYED_RELEASE_CAPSULE | ORAL | 1 refills | Status: AC

## 2020-12-11 MED ORDER — LEVOTHYROXINE SODIUM 50 MCG PO TABS: 50.0000 ug | ORAL_TABLET | Freq: Every day | ORAL | 1 refills | Status: AC

## 2020-12-11 MED ORDER — RAMIPRIL 10 MG PO CAPS: 10.0000 mg | ORAL_CAPSULE | Freq: Every day | ORAL | 1 refills | Status: DC

## 2020-12-11 MED ORDER — MIRABEGRON ER 25 MG PO TB24: 25.0000 mg | ORAL_TABLET | Freq: Every day | ORAL | 0 refills | Status: AC

## 2020-12-18 ENCOUNTER — Telehealth: Payer: Self-pay

## 2020-12-18 MED ORDER — RAMIPRIL 10 MG PO CAPS: 10.0000 mg | ORAL_CAPSULE | Freq: Every day | ORAL | 0 refills | Status: DC

## 2020-12-18 NOTE — Telephone Encounter (Signed)
..   LAST APPOINTMENT DATE: 10/18/2020   NEXT APPOINTMENT DATE:@Visit  date not found  MEDICATION:ramipril (ALTACE) 10 MG capsule    PHARMACY:[id number] [place], [place] - [address] AT [facility]

## 2020-12-18 NOTE — Telephone Encounter (Signed)
Rx sent to pharmacy   

## 2020-12-19 ENCOUNTER — Other Ambulatory Visit: Payer: Self-pay | Admitting: *Deleted

## 2020-12-19 MED ORDER — RAMIPRIL 10 MG PO CAPS: 10.0000 mg | ORAL_CAPSULE | Freq: Every day | ORAL | 0 refills | Status: AC

## 2021-01-15 ENCOUNTER — Ambulatory Visit: Payer: Medicare Other | Admitting: Neurology

## 2021-04-25 ENCOUNTER — Ambulatory Visit: Payer: Medicare Other | Admitting: Dermatology
